# Patient Record
Sex: Female | Born: 1935 | Race: White | Hispanic: No | Marital: Married | Smoking: Never smoker
Health system: Southern US, Community
[De-identification: ages and names within clinical notes are randomized; demographics above are authoritative.]

## PROBLEM LIST (undated history)

## (undated) DIAGNOSIS — I340 Nonrheumatic mitral (valve) insufficiency: Secondary | ICD-10-CM

## (undated) DIAGNOSIS — I482 Chronic atrial fibrillation, unspecified: Secondary | ICD-10-CM

## (undated) DIAGNOSIS — I10 Essential (primary) hypertension: Secondary | ICD-10-CM

## (undated) DIAGNOSIS — I2510 Atherosclerotic heart disease of native coronary artery without angina pectoris: Secondary | ICD-10-CM

## (undated) DIAGNOSIS — I272 Pulmonary hypertension, unspecified: Secondary | ICD-10-CM

## (undated) DIAGNOSIS — M66329 Spontaneous rupture of flexor tendons, unspecified upper arm: Secondary | ICD-10-CM

## (undated) DIAGNOSIS — F339 Major depressive disorder, recurrent, unspecified: Secondary | ICD-10-CM

## (undated) DIAGNOSIS — S43016A Anterior dislocation of unspecified humerus, initial encounter: Secondary | ICD-10-CM

## (undated) DIAGNOSIS — M75100 Unspecified rotator cuff tear or rupture of unspecified shoulder, not specified as traumatic: Secondary | ICD-10-CM

## (undated) DIAGNOSIS — E785 Hyperlipidemia, unspecified: Secondary | ICD-10-CM

## (undated) DIAGNOSIS — C50919 Malignant neoplasm of unspecified site of unspecified female breast: Secondary | ICD-10-CM

## (undated) DIAGNOSIS — I5032 Chronic diastolic (congestive) heart failure: Secondary | ICD-10-CM

## (undated) DIAGNOSIS — R5381 Other malaise: Secondary | ICD-10-CM

## (undated) DIAGNOSIS — IMO0002 Reserved for concepts with insufficient information to code with codable children: Secondary | ICD-10-CM

## (undated) DIAGNOSIS — E669 Obesity, unspecified: Secondary | ICD-10-CM

## (undated) DIAGNOSIS — I350 Nonrheumatic aortic (valve) stenosis: Secondary | ICD-10-CM

## (undated) DIAGNOSIS — D649 Anemia, unspecified: Secondary | ICD-10-CM

## (undated) DIAGNOSIS — M1990 Unspecified osteoarthritis, unspecified site: Secondary | ICD-10-CM

## (undated) DIAGNOSIS — L719 Rosacea, unspecified: Secondary | ICD-10-CM

## (undated) DIAGNOSIS — E039 Hypothyroidism, unspecified: Secondary | ICD-10-CM

## (undated) DIAGNOSIS — R5383 Other fatigue: Secondary | ICD-10-CM

## (undated) HISTORY — DX: Chronic atrial fibrillation, unspecified: I48.20

## (undated) HISTORY — DX: Anterior dislocation of unspecified humerus, initial encounter: S43.016A

## (undated) HISTORY — DX: Spontaneous rupture of flexor tendons, unspecified upper arm: M66.329

## (undated) HISTORY — PX: ABDOMINAL HYSTERECTOMY: SHX81

## (undated) HISTORY — DX: Reserved for concepts with insufficient information to code with codable children: IMO0002

## (undated) HISTORY — DX: Anemia, unspecified: D64.9

## (undated) HISTORY — DX: Unspecified rotator cuff tear or rupture of unspecified shoulder, not specified as traumatic: M75.100

## (undated) HISTORY — DX: Unspecified osteoarthritis, unspecified site: M19.90

## (undated) HISTORY — DX: Major depressive disorder, recurrent, unspecified: F33.9

## (undated) HISTORY — DX: Rosacea, unspecified: L71.9

## (undated) HISTORY — DX: Nonrheumatic aortic (valve) stenosis: I35.0

## (undated) HISTORY — DX: Malignant neoplasm of unspecified site of unspecified female breast: C50.919

## (undated) HISTORY — DX: Other fatigue: R53.83

## (undated) HISTORY — PX: TONSILLECTOMY: SUR1361

## (undated) HISTORY — DX: Chronic diastolic (congestive) heart failure: I50.32

## (undated) HISTORY — DX: Other malaise: R53.81

## (undated) HISTORY — PX: MASTECTOMY: SHX3

---

## 1998-03-28 ENCOUNTER — Ambulatory Visit: Admission: RE | Admit: 1998-03-28 | Discharge: 1998-03-28 | Payer: Self-pay | Admitting: Obstetrics and Gynecology

## 1998-03-28 ENCOUNTER — Encounter: Payer: Self-pay | Admitting: Obstetrics and Gynecology

## 1998-07-26 ENCOUNTER — Inpatient Hospital Stay (HOSPITAL_COMMUNITY): Admission: EM | Admit: 1998-07-26 | Discharge: 1998-07-27 | Payer: Self-pay | Admitting: Cardiology

## 1998-07-31 ENCOUNTER — Encounter: Admission: RE | Admit: 1998-07-31 | Discharge: 1998-07-31 | Payer: Self-pay | Admitting: Sports Medicine

## 1998-08-01 ENCOUNTER — Encounter: Admission: RE | Admit: 1998-08-01 | Discharge: 1998-08-01 | Payer: Self-pay | Admitting: Family Medicine

## 1998-08-02 ENCOUNTER — Encounter: Admission: RE | Admit: 1998-08-02 | Discharge: 1998-08-02 | Payer: Self-pay | Admitting: Family Medicine

## 1998-08-07 ENCOUNTER — Encounter: Admission: RE | Admit: 1998-08-07 | Discharge: 1998-08-07 | Payer: Self-pay | Admitting: Family Medicine

## 1998-08-10 ENCOUNTER — Encounter: Admission: RE | Admit: 1998-08-10 | Discharge: 1998-08-10 | Payer: Self-pay | Admitting: Family Medicine

## 1998-08-17 ENCOUNTER — Encounter: Admission: RE | Admit: 1998-08-17 | Discharge: 1998-08-17 | Payer: Self-pay | Admitting: Family Medicine

## 1998-08-24 ENCOUNTER — Encounter: Admission: RE | Admit: 1998-08-24 | Discharge: 1998-08-24 | Payer: Self-pay | Admitting: Sports Medicine

## 1998-09-04 ENCOUNTER — Encounter: Admission: RE | Admit: 1998-09-04 | Discharge: 1998-09-04 | Payer: Self-pay | Admitting: Family Medicine

## 1998-09-21 ENCOUNTER — Encounter: Admission: RE | Admit: 1998-09-21 | Discharge: 1998-09-21 | Payer: Self-pay | Admitting: Family Medicine

## 1998-09-28 ENCOUNTER — Encounter: Admission: RE | Admit: 1998-09-28 | Discharge: 1998-09-28 | Payer: Self-pay | Admitting: Family Medicine

## 1998-10-09 ENCOUNTER — Encounter: Admission: RE | Admit: 1998-10-09 | Discharge: 1998-10-09 | Payer: Self-pay | Admitting: Sports Medicine

## 1998-10-24 ENCOUNTER — Encounter: Admission: RE | Admit: 1998-10-24 | Discharge: 1998-10-24 | Payer: Self-pay | Admitting: Family Medicine

## 1998-10-30 ENCOUNTER — Encounter: Admission: RE | Admit: 1998-10-30 | Discharge: 1998-10-30 | Payer: Self-pay | Admitting: Sports Medicine

## 1998-11-22 ENCOUNTER — Encounter: Admission: RE | Admit: 1998-11-22 | Discharge: 1998-11-22 | Payer: Self-pay | Admitting: Family Medicine

## 1998-11-29 ENCOUNTER — Encounter: Admission: RE | Admit: 1998-11-29 | Discharge: 1998-11-29 | Payer: Self-pay | Admitting: Family Medicine

## 1998-12-06 ENCOUNTER — Encounter: Admission: RE | Admit: 1998-12-06 | Discharge: 1998-12-06 | Payer: Self-pay | Admitting: Family Medicine

## 1998-12-13 ENCOUNTER — Encounter: Admission: RE | Admit: 1998-12-13 | Discharge: 1998-12-13 | Payer: Self-pay | Admitting: Family Medicine

## 1998-12-27 ENCOUNTER — Encounter: Admission: RE | Admit: 1998-12-27 | Discharge: 1998-12-27 | Payer: Self-pay | Admitting: Family Medicine

## 1999-01-10 ENCOUNTER — Encounter: Admission: RE | Admit: 1999-01-10 | Discharge: 1999-01-10 | Payer: Self-pay | Admitting: Family Medicine

## 1999-01-23 ENCOUNTER — Encounter: Admission: RE | Admit: 1999-01-23 | Discharge: 1999-01-23 | Payer: Self-pay | Admitting: Sports Medicine

## 1999-03-13 ENCOUNTER — Encounter: Admission: RE | Admit: 1999-03-13 | Discharge: 1999-03-13 | Payer: Self-pay | Admitting: Sports Medicine

## 1999-04-23 ENCOUNTER — Ambulatory Visit (HOSPITAL_COMMUNITY): Admission: RE | Admit: 1999-04-23 | Discharge: 1999-04-23 | Payer: Self-pay | Admitting: Obstetrics and Gynecology

## 1999-04-23 ENCOUNTER — Encounter: Payer: Self-pay | Admitting: Obstetrics and Gynecology

## 1999-06-07 ENCOUNTER — Encounter: Admission: RE | Admit: 1999-06-07 | Discharge: 1999-06-07 | Payer: Self-pay | Admitting: Sports Medicine

## 1999-07-30 ENCOUNTER — Other Ambulatory Visit: Admission: RE | Admit: 1999-07-30 | Discharge: 1999-07-30 | Payer: Self-pay | Admitting: Obstetrics and Gynecology

## 1999-08-23 ENCOUNTER — Encounter: Admission: RE | Admit: 1999-08-23 | Discharge: 1999-08-23 | Payer: Self-pay | Admitting: Sports Medicine

## 1999-12-17 ENCOUNTER — Encounter: Admission: RE | Admit: 1999-12-17 | Discharge: 1999-12-17 | Payer: Self-pay | Admitting: Family Medicine

## 1999-12-20 ENCOUNTER — Encounter: Admission: RE | Admit: 1999-12-20 | Discharge: 1999-12-20 | Payer: Self-pay | Admitting: Family Medicine

## 2000-04-21 ENCOUNTER — Encounter: Admission: RE | Admit: 2000-04-21 | Discharge: 2000-04-21 | Payer: Self-pay | Admitting: Family Medicine

## 2000-04-24 ENCOUNTER — Encounter: Admission: RE | Admit: 2000-04-24 | Discharge: 2000-04-24 | Payer: Self-pay | Admitting: Sports Medicine

## 2000-09-03 ENCOUNTER — Other Ambulatory Visit: Admission: RE | Admit: 2000-09-03 | Discharge: 2000-09-03 | Payer: Self-pay | Admitting: Obstetrics and Gynecology

## 2000-10-17 ENCOUNTER — Encounter: Admission: RE | Admit: 2000-10-17 | Discharge: 2000-10-17 | Payer: Self-pay | Admitting: Sports Medicine

## 2000-10-17 ENCOUNTER — Encounter: Payer: Self-pay | Admitting: Sports Medicine

## 2000-12-02 ENCOUNTER — Encounter: Admission: RE | Admit: 2000-12-02 | Discharge: 2000-12-02 | Payer: Self-pay | Admitting: Family Medicine

## 2000-12-11 ENCOUNTER — Encounter: Admission: RE | Admit: 2000-12-11 | Discharge: 2000-12-11 | Payer: Self-pay | Admitting: Sports Medicine

## 2001-05-14 ENCOUNTER — Encounter: Admission: RE | Admit: 2001-05-14 | Discharge: 2001-05-14 | Payer: Self-pay | Admitting: Sports Medicine

## 2001-07-16 ENCOUNTER — Ambulatory Visit (HOSPITAL_COMMUNITY): Admission: RE | Admit: 2001-07-16 | Discharge: 2001-07-16 | Payer: Self-pay | Admitting: Family Medicine

## 2001-11-26 ENCOUNTER — Emergency Department (HOSPITAL_COMMUNITY): Admission: EM | Admit: 2001-11-26 | Discharge: 2001-11-26 | Payer: Self-pay | Admitting: Emergency Medicine

## 2002-04-08 ENCOUNTER — Encounter: Admission: RE | Admit: 2002-04-08 | Discharge: 2002-04-08 | Payer: Self-pay | Admitting: Family Medicine

## 2002-07-06 ENCOUNTER — Encounter: Admission: RE | Admit: 2002-07-06 | Discharge: 2002-07-06 | Payer: Self-pay | Admitting: Family Medicine

## 2002-07-08 ENCOUNTER — Encounter: Admission: RE | Admit: 2002-07-08 | Discharge: 2002-07-08 | Payer: Self-pay | Admitting: Sports Medicine

## 2003-03-25 ENCOUNTER — Encounter: Admission: RE | Admit: 2003-03-25 | Discharge: 2003-03-25 | Payer: Self-pay | Admitting: Sports Medicine

## 2003-04-14 ENCOUNTER — Encounter: Admission: RE | Admit: 2003-04-14 | Discharge: 2003-04-14 | Payer: Self-pay | Admitting: Sports Medicine

## 2003-08-04 ENCOUNTER — Encounter: Admission: RE | Admit: 2003-08-04 | Discharge: 2003-08-04 | Payer: Self-pay | Admitting: Sports Medicine

## 2003-08-24 ENCOUNTER — Encounter: Admission: RE | Admit: 2003-08-24 | Discharge: 2003-08-24 | Payer: Self-pay | Admitting: Family Medicine

## 2003-12-08 ENCOUNTER — Encounter: Admission: RE | Admit: 2003-12-08 | Discharge: 2003-12-08 | Payer: Self-pay | Admitting: Family Medicine

## 2004-04-27 ENCOUNTER — Encounter: Admission: RE | Admit: 2004-04-27 | Discharge: 2004-04-27 | Payer: Self-pay | Admitting: Sports Medicine

## 2004-07-26 ENCOUNTER — Ambulatory Visit: Payer: Self-pay | Admitting: Sports Medicine

## 2004-08-10 ENCOUNTER — Ambulatory Visit: Payer: Self-pay | Admitting: Sports Medicine

## 2004-08-30 ENCOUNTER — Ambulatory Visit: Payer: Self-pay | Admitting: Sports Medicine

## 2004-10-05 ENCOUNTER — Encounter: Admission: RE | Admit: 2004-10-05 | Discharge: 2004-10-05 | Payer: Self-pay | Admitting: Sports Medicine

## 2004-10-05 ENCOUNTER — Ambulatory Visit: Payer: Self-pay | Admitting: Sports Medicine

## 2004-11-22 ENCOUNTER — Ambulatory Visit: Payer: Self-pay | Admitting: Sports Medicine

## 2005-03-27 ENCOUNTER — Ambulatory Visit: Payer: Self-pay | Admitting: Family Medicine

## 2005-06-14 ENCOUNTER — Encounter: Admission: RE | Admit: 2005-06-14 | Discharge: 2005-06-14 | Payer: Self-pay | Admitting: Sports Medicine

## 2006-07-02 ENCOUNTER — Encounter: Admission: RE | Admit: 2006-07-02 | Discharge: 2006-07-02 | Payer: Self-pay | Admitting: Sports Medicine

## 2006-07-03 DIAGNOSIS — M1990 Unspecified osteoarthritis, unspecified site: Secondary | ICD-10-CM

## 2006-07-03 DIAGNOSIS — F339 Major depressive disorder, recurrent, unspecified: Secondary | ICD-10-CM | POA: Insufficient documentation

## 2006-07-03 DIAGNOSIS — L719 Rosacea, unspecified: Secondary | ICD-10-CM

## 2006-07-03 DIAGNOSIS — I10 Essential (primary) hypertension: Secondary | ICD-10-CM | POA: Insufficient documentation

## 2006-07-17 ENCOUNTER — Ambulatory Visit (HOSPITAL_COMMUNITY): Admission: RE | Admit: 2006-07-17 | Discharge: 2006-07-17 | Payer: Self-pay | Admitting: Sports Medicine

## 2006-07-17 ENCOUNTER — Ambulatory Visit: Payer: Self-pay | Admitting: Sports Medicine

## 2006-07-17 DIAGNOSIS — I482 Chronic atrial fibrillation, unspecified: Secondary | ICD-10-CM

## 2006-07-17 DIAGNOSIS — E039 Hypothyroidism, unspecified: Secondary | ICD-10-CM | POA: Insufficient documentation

## 2006-07-17 DIAGNOSIS — E669 Obesity, unspecified: Secondary | ICD-10-CM

## 2006-07-17 HISTORY — DX: Chronic atrial fibrillation, unspecified: I48.20

## 2006-07-17 LAB — CONVERTED CEMR LAB
Cholesterol: 243 mg/dL — ABNORMAL HIGH (ref 0–200)
HCT: 41.5 %
Hemoglobin: 13.9 g/dL
TSH: 2.733 microintl units/mL (ref 0.350–5.50)

## 2006-07-30 ENCOUNTER — Telehealth: Payer: Self-pay | Admitting: Sports Medicine

## 2006-08-14 ENCOUNTER — Encounter: Payer: Self-pay | Admitting: Sports Medicine

## 2006-08-28 ENCOUNTER — Ambulatory Visit: Payer: Self-pay | Admitting: Sports Medicine

## 2006-09-23 ENCOUNTER — Telehealth: Payer: Self-pay | Admitting: Sports Medicine

## 2006-11-17 ENCOUNTER — Encounter: Payer: Self-pay | Admitting: *Deleted

## 2007-02-19 ENCOUNTER — Ambulatory Visit: Payer: Self-pay | Admitting: Sports Medicine

## 2007-02-19 DIAGNOSIS — IMO0001 Reserved for inherently not codable concepts without codable children: Secondary | ICD-10-CM

## 2007-02-24 LAB — CONVERTED CEMR LAB
Anti Nuclear Antibody(ANA): NEGATIVE
HCT: 34.7 % — ABNORMAL LOW (ref 36.0–46.0)
Hemoglobin: 10.4 g/dL — ABNORMAL LOW (ref 12.0–15.0)
MCHC: 30 g/dL (ref 30.0–36.0)
MCV: 83.2 fL (ref 78.0–100.0)
Platelets: 318 10*3/uL (ref 150–400)
RBC: 4.17 M/uL (ref 3.87–5.11)
RDW: 14.5 % — ABNORMAL HIGH (ref 11.5–14.0)
Rheumatoid fact SerPl-aCnc: <20 intl units/mL (ref 0–20)
Sed Rate: 14 mm/hr (ref 0–22)
Total CK: 75 units/L (ref 7–177)
WBC: 7.7 10*3/uL (ref 4.0–10.5)

## 2007-02-26 ENCOUNTER — Ambulatory Visit: Payer: Self-pay | Admitting: Family Medicine

## 2007-02-26 ENCOUNTER — Encounter: Payer: Self-pay | Admitting: Sports Medicine

## 2007-02-26 DIAGNOSIS — D649 Anemia, unspecified: Secondary | ICD-10-CM | POA: Insufficient documentation

## 2007-02-26 LAB — CONVERTED CEMR LAB: Ferritin: 5 ng/mL — ABNORMAL LOW (ref 10–291)

## 2007-03-03 ENCOUNTER — Encounter (INDEPENDENT_AMBULATORY_CARE_PROVIDER_SITE_OTHER): Payer: Self-pay | Admitting: *Deleted

## 2007-03-06 ENCOUNTER — Encounter: Payer: Self-pay | Admitting: Sports Medicine

## 2007-03-17 ENCOUNTER — Encounter: Payer: Self-pay | Admitting: Sports Medicine

## 2007-03-31 ENCOUNTER — Encounter: Payer: Self-pay | Admitting: Sports Medicine

## 2007-04-09 ENCOUNTER — Ambulatory Visit: Payer: Self-pay | Admitting: Sports Medicine

## 2007-04-20 ENCOUNTER — Ambulatory Visit (HOSPITAL_COMMUNITY): Admission: RE | Admit: 2007-04-20 | Discharge: 2007-04-20 | Payer: Self-pay | Admitting: Gastroenterology

## 2007-04-21 ENCOUNTER — Encounter: Payer: Self-pay | Admitting: Sports Medicine

## 2007-04-22 ENCOUNTER — Encounter: Payer: Self-pay | Admitting: Sports Medicine

## 2007-06-01 ENCOUNTER — Encounter: Payer: Self-pay | Admitting: *Deleted

## 2007-06-10 ENCOUNTER — Telehealth: Payer: Self-pay | Admitting: *Deleted

## 2007-06-15 ENCOUNTER — Encounter: Payer: Self-pay | Admitting: *Deleted

## 2007-06-17 ENCOUNTER — Ambulatory Visit: Payer: Self-pay | Admitting: Sports Medicine

## 2007-06-18 LAB — CONVERTED CEMR LAB
HCT: 33.6 % — ABNORMAL LOW (ref 36.0–46.0)
Hemoglobin: 10.6 g/dL — ABNORMAL LOW (ref 12.0–15.0)
MCHC: 31.5 g/dL (ref 30.0–36.0)
MCV: 87.5 fL (ref 78.0–100.0)
Platelets: 290 10*3/uL (ref 150–400)
RBC: 3.84 M/uL — ABNORMAL LOW (ref 3.87–5.11)
RDW: 17.7 % — ABNORMAL HIGH (ref 11.5–15.5)
WBC: 8.4 10*3/uL (ref 4.0–10.5)

## 2007-07-09 ENCOUNTER — Ambulatory Visit: Payer: Self-pay | Admitting: Sports Medicine

## 2007-09-23 ENCOUNTER — Ambulatory Visit: Payer: Self-pay | Admitting: Family Medicine

## 2007-09-23 ENCOUNTER — Encounter: Payer: Self-pay | Admitting: Sports Medicine

## 2007-09-23 LAB — CONVERTED CEMR LAB
HCT: 32.5 % — ABNORMAL LOW (ref 36.0–46.0)
Hemoglobin: 9.9 g/dL — ABNORMAL LOW (ref 12.0–15.0)
MCHC: 30.5 g/dL (ref 30.0–36.0)
MCV: 87.4 fL (ref 78.0–100.0)
Platelets: 312 10*3/uL (ref 150–400)
RBC: 3.72 M/uL — ABNORMAL LOW (ref 3.87–5.11)
RDW: 15.1 % (ref 11.5–15.5)
WBC: 8.3 10*3/uL (ref 4.0–10.5)

## 2007-09-24 ENCOUNTER — Telehealth: Payer: Self-pay | Admitting: *Deleted

## 2007-09-25 ENCOUNTER — Encounter: Payer: Self-pay | Admitting: *Deleted

## 2007-09-25 ENCOUNTER — Telehealth: Payer: Self-pay | Admitting: *Deleted

## 2007-09-29 ENCOUNTER — Encounter: Payer: Self-pay | Admitting: Sports Medicine

## 2007-09-29 ENCOUNTER — Ambulatory Visit: Payer: Self-pay | Admitting: Family Medicine

## 2007-09-29 LAB — CONVERTED CEMR LAB
Basophils Absolute: 0 10*3/uL (ref 0.0–0.1)
Basophils Relative: 0 % (ref 0–1)
Eosinophils Absolute: 1 10*3/uL — ABNORMAL HIGH (ref 0.0–0.7)
Eosinophils Relative: 13 % — ABNORMAL HIGH (ref 0–5)
Ferritin: 5 ng/mL — ABNORMAL LOW (ref 10–291)
HCT: 30.4 % — ABNORMAL LOW (ref 36.0–46.0)
Hemoglobin: 9.1 g/dL — ABNORMAL LOW (ref 12.0–15.0)
Lymphocytes Relative: 16 % (ref 12–46)
Lymphs Abs: 1.2 10*3/uL (ref 0.7–4.0)
MCHC: 29.9 g/dL — ABNORMAL LOW (ref 30.0–36.0)
MCV: 86.4 fL (ref 78.0–100.0)
Monocytes Absolute: 0.7 10*3/uL (ref 0.1–1.0)
Monocytes Relative: 9 % (ref 3–12)
Neutro Abs: 4.7 10*3/uL (ref 1.7–7.7)
Neutrophils Relative %: 61 % (ref 43–77)
Platelets: 282 10*3/uL (ref 150–400)
RBC: 3.52 M/uL — ABNORMAL LOW (ref 3.87–5.11)
RDW: 15 % (ref 11.5–15.5)
WBC: 7.6 10*3/uL (ref 4.0–10.5)

## 2007-10-02 ENCOUNTER — Encounter: Payer: Self-pay | Admitting: Sports Medicine

## 2007-10-07 ENCOUNTER — Encounter: Payer: Self-pay | Admitting: Sports Medicine

## 2007-10-07 LAB — CONVERTED CEMR LAB
OCCULT 1: POSITIVE
OCCULT 2: POSITIVE
OCCULT 3: POSITIVE

## 2007-10-08 ENCOUNTER — Encounter (HOSPITAL_COMMUNITY): Admission: RE | Admit: 2007-10-08 | Discharge: 2008-01-06 | Payer: Self-pay | Admitting: Sports Medicine

## 2007-10-08 ENCOUNTER — Encounter: Payer: Self-pay | Admitting: Sports Medicine

## 2007-10-21 ENCOUNTER — Encounter: Admission: RE | Admit: 2007-10-21 | Discharge: 2007-10-21 | Payer: Self-pay | Admitting: Sports Medicine

## 2007-10-22 ENCOUNTER — Encounter: Payer: Self-pay | Admitting: Sports Medicine

## 2007-11-23 ENCOUNTER — Encounter: Payer: Self-pay | Admitting: Sports Medicine

## 2007-11-25 ENCOUNTER — Encounter: Payer: Self-pay | Admitting: Sports Medicine

## 2008-03-02 ENCOUNTER — Ambulatory Visit (HOSPITAL_COMMUNITY): Admission: RE | Admit: 2008-03-02 | Discharge: 2008-03-02 | Payer: Self-pay | Admitting: Geriatric Medicine

## 2008-09-16 ENCOUNTER — Ambulatory Visit: Payer: Self-pay | Admitting: Sports Medicine

## 2008-09-16 DIAGNOSIS — M25519 Pain in unspecified shoulder: Secondary | ICD-10-CM

## 2008-10-07 ENCOUNTER — Ambulatory Visit: Payer: Self-pay | Admitting: Sports Medicine

## 2008-11-09 ENCOUNTER — Ambulatory Visit: Payer: Self-pay | Admitting: Sports Medicine

## 2009-01-11 ENCOUNTER — Encounter: Admission: RE | Admit: 2009-01-11 | Discharge: 2009-01-11 | Payer: Self-pay | Admitting: Geriatric Medicine

## 2009-03-02 ENCOUNTER — Ambulatory Visit: Payer: Self-pay | Admitting: Sports Medicine

## 2009-03-02 DIAGNOSIS — M79609 Pain in unspecified limb: Secondary | ICD-10-CM | POA: Insufficient documentation

## 2009-03-02 DIAGNOSIS — M66329 Spontaneous rupture of flexor tendons, unspecified upper arm: Secondary | ICD-10-CM

## 2009-06-05 ENCOUNTER — Ambulatory Visit: Payer: Self-pay | Admitting: Sports Medicine

## 2009-06-05 DIAGNOSIS — M25569 Pain in unspecified knee: Secondary | ICD-10-CM | POA: Insufficient documentation

## 2009-07-03 ENCOUNTER — Ambulatory Visit: Payer: Self-pay | Admitting: Sports Medicine

## 2009-08-14 ENCOUNTER — Ambulatory Visit: Payer: Self-pay | Admitting: Sports Medicine

## 2009-08-14 DIAGNOSIS — M75100 Unspecified rotator cuff tear or rupture of unspecified shoulder, not specified as traumatic: Secondary | ICD-10-CM

## 2009-08-14 DIAGNOSIS — IMO0002 Reserved for concepts with insufficient information to code with codable children: Secondary | ICD-10-CM | POA: Insufficient documentation

## 2009-09-11 ENCOUNTER — Encounter: Payer: Self-pay | Admitting: Family Medicine

## 2009-12-14 ENCOUNTER — Ambulatory Visit: Payer: Self-pay | Admitting: Sports Medicine

## 2010-02-27 ENCOUNTER — Encounter: Admission: RE | Admit: 2010-02-27 | Discharge: 2010-02-27 | Payer: Self-pay | Admitting: Geriatric Medicine

## 2010-03-26 ENCOUNTER — Ambulatory Visit: Payer: Self-pay | Admitting: Sports Medicine

## 2010-03-26 DIAGNOSIS — S43016A Anterior dislocation of unspecified humerus, initial encounter: Secondary | ICD-10-CM | POA: Insufficient documentation

## 2010-04-03 ENCOUNTER — Ambulatory Visit: Payer: Self-pay | Admitting: Sports Medicine

## 2010-04-24 ENCOUNTER — Ambulatory Visit: Payer: Self-pay | Admitting: Sports Medicine

## 2010-05-15 ENCOUNTER — Ambulatory Visit
Admission: RE | Admit: 2010-05-15 | Discharge: 2010-05-15 | Payer: Self-pay | Source: Home / Self Care | Attending: Sports Medicine | Admitting: Sports Medicine

## 2010-06-05 ENCOUNTER — Ambulatory Visit
Admission: RE | Admit: 2010-06-05 | Discharge: 2010-06-05 | Payer: Self-pay | Source: Home / Self Care | Attending: Sports Medicine | Admitting: Sports Medicine

## 2010-06-05 NOTE — Letter (Signed)
Summary: Wellness Visit Letter  [facility]  [address]   [place], [place] [address]   Phone: [phone]  Fax: [phone]    09/11/2009  Tara Mack [address] [place], [place]  [address]  Dear Ms. Bierlein,  We are happy to let you know that since you are covered under Medicare you are able to have a FREE visit at the [facility] to discuss your HEALTH. This is a new benefit for Medicare.  There will be no co-payment.  At this visit you will meet with Luretha Murphy an expert in wellness and the nurse practitioner at our clinic.  At this visit we will discuss ways to keep you healthy and feeling well.  This visit will not replace your regular doctor visit and we cannot refill medications.  We may schedule future blood work, give shots if needed, or schedule tests to look for hidden problems.   You will need to plan to be here at least one hour to talk about your medical history, your current status, review all of your medications, and discuss your future plans for your health.  This information will be entered into your record for your doctor to have and review.  If you are interested in staying healthy, this type of visit can help.  Please call the office at: [phone], to schedule a "Medicare Wellness Visit".  The day of the visit you should bring in all of your medications, including any vitamins, herbs, over the counter products you take.  Make a list of all the other doctors that you see, so we know who they are. If you have any other health documents please bring them.  We look forward to helping you stay healthy.  Sincerely,    Luretha Murphy NP  Appended Document: Wellness Visit Letter mailed.

## 2010-06-05 NOTE — Assessment & Plan Note (Signed)
Summary: L ARM/ R KNEE PAIN,MC   Vital Signs:  Patient profile:   77 year old female BP sitting:   187 / 85  Vitals Entered By: Lillia Pauls CMA (June 05, 2009 11:03 AM)  Serial Vital Signs/Assessments:  Time      Position  BP       Pulse  Resp  Temp     By 11:49 AM            150/78                         Delbert Harness MD   History of Present Illness: 77 yo here today for re-eval of left biceps tendon rupture and for new right knee pain.  1.) Patient with rupture of left biceps tendon October 2010.  Since that time has chosen conservative treatment.  Reports minimal pain with good range of motion and stregth.  Would like to return to light weightlifting.  2.) Two-week history of right knee pain with onset at time was walking down stairs and knee "gave way."  Was able to immediately continue walking.  Became swollen the next day.  Since that time pain has improved, but continues to have some swelling.  No popping, locking, or clicking, giving way since then.  has been takign voltaren as she had been for other MSK pain.  Allergies: No Known Drug Allergies  Review of Systems      See HPI MS:  Complains of joint pain and joint swelling; denies joint redness, loss of strength, muscle aches, and cramps.  Physical Exam  General:  obese, in no acute distress; alert,appropriate and cooperative throughout examination.  appears younger than age. Msk:  L arm: Popeye deformity, no TTP FROM shoulder and elbow, good stregth bilaterally strength on left is now moderate  R knee: suprapatellar effusion.  + mcmurrays with Crepitus.  Mild tenderness to palpation over superiolateral effusion and anteromedially.   No tenderness over joint line or patella.  Full extension of knee with no pain on valgus or varus stress.  No joint laxity.  US shows significant effusion with vastus medialis tear and tear in medial meniscus. swelling over meniscal area There is a large suprapatellar pouch  effusion the vast lateralis tendon is retracted and there is a large superficial pouch that is filled with fluid   Impression & Recommendations:  Problem # 1:  BICEPS TENDON RUPTURE, LEFT (ICD-727.62) Healing well.  Patient may restart light weights as tolerated.  Problem # 2:  KNEE PAIN, RIGHT (ICD-719.46)  s/p injury 2 weeks ago likely resulting in vastus medialis/medial meniscus tear with resulting effusion.  Improving clinically.  Will fit for knee brace today and instructed on non-weight bearing exercises such as cycling over walking and given straight leg raises to do from home.     Her updated medication list for this problem includes:    Diclofenac Sodium 50 Mg Tbec (Diclofenac sodium) .Marland Kitchen... Tablet by mouth twice a day    Vicodin 5-500 Mg Tabs (Hydrocodone-acetaminophen) .Marland Kitchen... Take one every 6 hours as needed for  pain  Orders: Knee Support Pat cutout [phone])  Complete Medication List: 1)  Diclofenac Sodium 50 Mg Tbec (Diclofenac sodium) .... Tablet by mouth twice a day 2)  Ritalin 10 Mg Tabs (Methylphenidate hcl) .... Take one tablet twice a day as directed 3)  Hyzaar 100-25 Mg Tabs (Losartan potassium-hctz) .... Take 1 tablet by mouth once a day 4)  Retin-a 0.05 %  Crea (Tretinoin) .... Apply 1 a small amount to skin once a day 5)  Rhinocort Aqua 32 Mcg/act Susp (Budesonide (nasal)) .... Inhale 1 spray as directed twice a day 6)  Synthroid 50 Mcg Tabs (Levothyroxine sodium) .... Take 1 tablet by mouth once a day 7)  Vivelle-dot 0.1 Mg/24hr Pttw (Estradiol) .Marland Kitchen.. 1 patch to skin twice a week 8)  Effexor Xr 75 Mg Cp24 (Venlafaxine hcl) .... Take one at bedtime 9)  Vicodin 5-500 Mg Tabs (Hydrocodone-acetaminophen) .... Take one every 6 hours as needed for  pain 10)  Ferrous Sulfate 220 (44 Fe) Mg/57ml Elix (Ferrous sulfate) .Marland Kitchen.. 1 tsp tid 11)  Lidoderm 5 % Ptch (Lidocaine) .... Use as directed for 12 hours daily 12)  Voltaren 1 % Gel (Diclofenac sodium) .Marland Kitchen.. 1 to 2 grams qid as  directed  Patient Instructions: 1)  Do straight leg raises as instructed. 2)  Wear knee brace for stability. 3)  May resume light arm weight lifting. 4)  Try cycling instead of treadmill while your knee is healing. 5)  Follow-up with your PCP for your elevated blood pressure. 6)  Follow-up in 1 month

## 2010-06-05 NOTE — Assessment & Plan Note (Signed)
Summary: F/U [facility]   Vital Signs:  Patient profile:   77 year old female BP sitting:   160 / 90  Vitals Entered By: Lillia Pauls CMA (July 03, 2009 11:50 AM)  History of Present Illness: Kriston returns for f.u RT knee pain feels swelling is less VMO tear noted last visit knee - don joy support makes her more stable this week tried some treadmill and bike and was able to do without much pain  left biceps tendon rupture tried using light weights again no swellikng afterwards less aching on most days now  Allergies: No Known Drug Allergies  Physical Exam  General:  Well-developed,well-nourished,in no acute distress; alert,appropriate and cooperative throughout examination Msk:  localized swelling over distal VMO on RT knee joint exam shows no effusion; stable ligaments; + clicking on Mcmurray's and provocative meniscal tests;these are slightly painful  non painful patellar compression; patellar and quadriceps tendons unremarkable.  left biceps shows popeye deformity now non tender no swelling or bruising  Additional Exam:  MSK [place] RT knee shows a collection of fluid at distal VMO This is significant but less than last visit suprapatellar pouch shows less swelling but slight increase remainder of joint without sings of effusion  Left biceps shows defined defect no real swelling noted or change with doppler  images saved   Impression & Recommendations:  Problem # 1:  KNEE PAIN, RIGHT (ICD-719.46)  Her updated medication list for this problem includes:    Diclofenac Sodium 50 Mg Tbec (Diclofenac sodium) .Marland Kitchen... Tablet by mouth twice a day    Vicodin 5-500 Mg Tabs (Hydrocodone-acetaminophen) .Marland Kitchen... Take one every 6 hours as needed for  pain  partial tear of VMO tendon is still present less swelling noted less pain  begin more quad exercises and some easy cycling  reck 1 month  Orders: [place] LIMITED ([id number])  Problem # 2:  BICEPS TENDON RUPTURE, LEFT  (ICD-727.62)  This looks more stable now on scan will let her start with light weights  Orders: [place] LIMITED ([id number])  Complete Medication List: 1)  Diclofenac Sodium 50 Mg Tbec (Diclofenac sodium) .... Tablet by mouth twice a day 2)  Ritalin 10 Mg Tabs (Methylphenidate hcl) .... Take one tablet twice a day as directed 3)  Hyzaar 100-25 Mg Tabs (Losartan potassium-hctz) .... Take 1 tablet by mouth once a day 4)  Retin-a 0.05 % Crea (Tretinoin) .... Apply 1 a small amount to skin once a day 5)  Rhinocort Aqua 32 Mcg/act Susp (Budesonide (nasal)) .... Inhale 1 spray as directed twice a day 6)  Synthroid 50 Mcg Tabs (Levothyroxine sodium) .... Take 1 tablet by mouth once a day 7)  Vivelle-dot 0.1 Mg/24hr Pttw (Estradiol) .Marland Kitchen.. 1 patch to skin twice a week 8)  Effexor Xr 75 Mg Cp24 (Venlafaxine hcl) .... Take one at bedtime 9)  Vicodin 5-500 Mg Tabs (Hydrocodone-acetaminophen) .... Take one every 6 hours as needed for  pain 10)  Ferrous Sulfate 220 (44 Fe) Mg/58ml Elix (Ferrous sulfate) .Marland Kitchen.. 1 tsp tid 11)  Lidoderm 5 % Ptch (Lidocaine) .... Use as directed for 12 hours daily 12)  Voltaren 1 % Gel (Diclofenac sodium) .Marland Kitchen.. 1 to 2 grams qid as directed  Patient Instructions: 1)  arm 2)  easy curls - do sets of 6 to 8 reps 3)  easy forearm rolls 4)  triceps curls 5)  forward flexion - take with arm straight to 90 degs 6)  if not pain take up slightly higher 7)  leg  8)  continue straight leg raises 9)  do some side leg raises 10)  OK to use treadmill 11)  get some biking when you can

## 2010-06-05 NOTE — Assessment & Plan Note (Signed)
Summary: U/S DISLOCATED L SHOULDER PER FIELDS,MC   CC:  f/u L shoulder.  History of Present Illness: 77yo R-hand dominant female to office for f/u L shoulder. s/p L shoulder dislocation 2-weeks ago when fell in the bathroom in [place]. Still having some pain & stiffness.  Still with night-time pain.  Using percocet prn which seems to help.  Stopped using sling 1-week ago. Doing shoulder ROM exercises with shoulder circles & wall crawls.  Feels ROM is improving. Denies any numbness/tingling. Hx of Lt biceps long head rupture 02/2009.  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  TTP over bicipital groove, supraspinatus, & deltoid. Decreased ROM - flexion 130, abduction 130, IR L5, ER 30 - all with pain. Neg drop arm.  (+)empty can. +4/5 RTC strength with flexion, abduction, IR, ER. Some pain with bicep strength testing. - R shoulder with full ROM without pain, tenderness, weakness.  Neurovascularly intact distally Additional Exam:  MSK U/S: L shoulder - technically difficult study.  biceps tendon with large fluid collection in tendon with increased doppler flow - suspect acute on chronic biceps rupture.  Subscapularis without signs of acute tear.  Supraspinatus without signs of acute tear, large calcification noted near tendon  insertion.  Infraspinatus without signs of acute tear.  AC joint with moderate effusion & spurring.  Images saved.   Impression & Recommendations:  Problem # 1:  CLOSED ANTERIOR DISLOCATION OF HUMERUS (ICD-831.01) Assessment Improved - Improved ROM on exam today - MSK u/s revealed signs of acute on chronic biceps tendon tear, but no signs of full thickness RTC tear. - Should cont. with ROM exercises as shown last office visit.  Emphasized need to do exercises 2-3 times a day to prevent stiffness & frozen shoulder.  Plan to transition to strengthening as ROM & pain improve. - Cont. voltaren & percocet as needed - f/u 3-4 weeks for re-evaluation, encouraged to call with any questions/concerns.  Orders: [place] LIMITED ([id number])  Problem # 2:  BICEPS TENDON RUPTURE, LEFT (ICD-727.62) - Suspect acute on chronic L biceps tendon rupture  - MSK u/s revealed large fluid collection along Long Head of bicep with increased doppler flow. - Cont. ROM exercises for shoulder as stated above.  As pain in shoulder/arm improves plan to re-initiate bicep exercises. - f/u 3-4 weeks for re-evaluation, encouraged to call with questions/concerns.  Orders: [place] LIMITED ([id number])  Complete Medication List: 1)  Diclofenac Sodium 50 Mg Tbec (Diclofenac sodium) .... Tablet by mouth twice a day 2)  Ritalin 10 Mg Tabs (Methylphenidate hcl) .... Take one tablet twice a day as directed 3)  Hyzaar 100-25 Mg Tabs (Losartan potassium-hctz) .... Take 1 tablet by mouth once a day 4)  Retin-a 0.05 % Crea (Tretinoin) .... Apply 1 a small amount to skin once a day 5)  Rhinocort Aqua 32 Mcg/act Susp (Budesonide (nasal)) .Marland KitchenMarland KitchenMarland Kitchen  Inhale 1 spray as directed twice a day 6)  Synthroid 50 Mcg Tabs (Levothyroxine sodium) .... Take 1 tablet by mouth once a day 7)  Vivelle-dot 0.1 Mg/24hr Pttw (Estradiol) .Marland Kitchen.. 1 patch to skin twice a week 8)  Effexor Xr 75 Mg Cp24 (Venlafaxine hcl) .... Take one at bedtime 9)  Vicodin 5-500 Mg Tabs  (Hydrocodone-acetaminophen) .... Take one every 6 hours as needed for  pain 10)  Ferrous Sulfate 220 (44 Fe) Mg/39ml Elix (Ferrous sulfate) .Marland Kitchen.. 1 tsp tid 11)  Lidoderm 5 % Ptch (Lidocaine) .... Use as directed for 12 hours daily 12)  Voltaren 1 % Gel (Diclofenac sodium) .Marland Kitchen.. 1 to 2 grams qid as directed   Orders Added: 1)  Est. Patient Level III [[address]] 2)  [place] LIMITED [[id number]]

## 2010-06-05 NOTE — Assessment & Plan Note (Signed)
Summary: 12:00APPT,F/U SHOULDER PER FIELDS,MC   Vital Signs:  Patient profile:   77 year old female Pulse rate:   64 / minute BP sitting:   135 / 77  (right arm)  Vitals Entered By: Rochele Pages RN (March 26, 2010 11:58 AM) CC: f/u dislocated lt shoulder   CC:  f/u dislocated lt shoulder.  History of Present Illness: Pt presents to clinic for f/u of dislocated left shoulder sustained approximatly 1 week ago while on vacation in [place]. [place].  The shoulder was found to be dislocated per X-ray and was initially unsucessfuly reduced with a subsequent successful reduction.  No fracture was noted.   The patient has been immobilzing the shoulder with an arm sling but has been doing some very slight ROM while trying to avoid lifting, or any external rotaiton.  She does still require percocet on occasion and has had her sleep disrupted 2o/2 pain.    Allergies: No Known Drug Allergies  Physical Exam  General:  Well-developed,well-nourished, appropriate and cooperative throughout examination.  Mild physical distress with favoring her L arm and utilizing a sling for that arm.   Msk:  L arm:  TTP over L deltoid, supraspinatus & levator scapulae.  ROM tested to initiation of pain.    bruising upper biceeps OK with 30 deg flex. ext, adduction and abduction  old bicipital tendon rupture on Lt Skin:  Diffuse ecchymosis on L anterior/lateral aspect of arm.     Impression & Recommendations:  Problem # 1:  CLOSED ANTERIOR DISLOCATION OF HUMERUS (ICD-831.01) Pt to maintain ROM (limiting her range by pain) by focusing on Flexion and ABduction exercises.  She is to avoid external rotation, and extreme shoulder extension.  Wear the sling for comfort and continue pain medication as needed as provided from the ER.   Followup in 1 week to U/S and more thorough PE.  Plan to advance to strenthening exercises at that time  given Codman exercise sheet  Complete Medication List: 1)  Diclofenac Sodium 50 Mg  Tbec (Diclofenac sodium) .... Tablet by mouth twice a day 2)  Ritalin 10 Mg Tabs (Methylphenidate hcl) .... Take one tablet twice a day as directed 3)  Hyzaar 100-25 Mg Tabs (Losartan potassium-hctz) .... Take 1 tablet by mouth once a day 4)  Retin-a 0.05 % Crea (Tretinoin) .... Apply 1 a small amount to skin once a day 5)  Rhinocort Aqua 32 Mcg/act Susp (Budesonide (nasal)) .... Inhale 1 spray as directed twice a day 6)  Synthroid 50 Mcg Tabs (Levothyroxine sodium) .... Take 1 tablet by mouth once a day 7)  Vivelle-dot 0.1 Mg/24hr Pttw (Estradiol) .Marland Kitchen.. 1 patch to skin twice a week 8)  Effexor Xr 75 Mg Cp24 (Venlafaxine hcl) .... Take one at bedtime 9)  Vicodin 5-500 Mg Tabs (Hydrocodone-acetaminophen) .... Take one every 6 hours as needed for  pain 10)  Ferrous Sulfate 220 (44 Fe) Mg/63ml Elix (Ferrous sulfate) .Marland Kitchen.. 1 tsp tid 11)  Lidoderm 5 % Ptch (Lidocaine) .... Use as directed for 12 hours daily 12)  Voltaren 1 % Gel (Diclofenac sodium) .Marland Kitchen.. 1 to 2 grams qid as directed   Orders Added: 1)  Est. Patient Level III [[id number]]

## 2010-06-05 NOTE — Assessment & Plan Note (Signed)
Summary: F/U,MC   Vital Signs:  Patient profile:   77 year old female BP sitting:   172 / 82  Vitals Entered By: Lillia Pauls CMA (August 14, 2009 11:57 AM)  History of Present Illness: pt here today to f/u right knee pain and left bicep pain. Per pt, the R knee is feeling better, about 25% she states and the L bicep pain is significantly better at 65%. Now having some slight pain with the right arm that she attributes to arthritis. she does note that she has been using RT arm more since left injured  she feels like motin of left arm is almost normal now  RT knee still has swelling above knee but not worsening  Allergies: No Known Drug Allergies  Physical Exam  General:  Well-developed,well-nourished,in no acute distress; alert,appropriate and cooperative throughout examination Msk:  Moderate swelling above RT knee over Vast Lat tendon knee motion is good not TTP  RT shoulder mild pain on abduction and on IR/ER full motion except tight on back scratch no drop arm BT OK  Left arm shows popeye deformity not TTP good strength gain   Impression & Recommendations:  Problem # 1:  KNEE PAIN, RIGHT (ICD-719.46)  Her updated medication list for this problem includes:    Diclofenac Sodium 50 Mg Tbec (Diclofenac sodium) .Marland Kitchen... Tablet by mouth twice a day    Vicodin 5-500 Mg Tabs (Hydrocodone-acetaminophen) .Marland Kitchen... Take one every 6 hours as needed for  pain  kkep up biking and few SLRs use knee sleeve as needed  partial vast lat tear - no worsening  Problem # 2:  BICEPS TENDON RUPTURE, LEFT (ICD-727.62) definitely imprved use light weight for exercise avoid anything heavy  Problem # 3:  SUBACROMIAL BURSITIS, RIGHT (ICD-726.19) This seems mild now restart her ROM exercises work full ROM before adding any weight  if not improving RTC for injection  Complete Medication List: 1)  Diclofenac Sodium 50 Mg Tbec (Diclofenac sodium) .... Tablet by mouth twice a day 2)  Ritalin  10 Mg Tabs (Methylphenidate hcl) .... Take one tablet twice a day as directed 3)  Hyzaar 100-25 Mg Tabs (Losartan potassium-hctz) .... Take 1 tablet by mouth once a day 4)  Retin-a 0.05 % Crea (Tretinoin) .... Apply 1 a small amount to skin once a day 5)  Rhinocort Aqua 32 Mcg/act Susp (Budesonide (nasal)) .... Inhale 1 spray as directed twice a day 6)  Synthroid 50 Mcg Tabs (Levothyroxine sodium) .... Take 1 tablet by mouth once a day 7)  Vivelle-dot 0.1 Mg/24hr Pttw (Estradiol) .Marland Kitchen.. 1 patch to skin twice a week 8)  Effexor Xr 75 Mg Cp24 (Venlafaxine hcl) .... Take one at bedtime 9)  Vicodin 5-500 Mg Tabs (Hydrocodone-acetaminophen) .... Take one every 6 hours as needed for  pain 10)  Ferrous Sulfate 220 (44 Fe) Mg/52ml Elix (Ferrous sulfate) .Marland Kitchen.. 1 tsp tid 11)  Lidoderm 5 % Ptch (Lidocaine) .... Use as directed for 12 hours daily 12)  Voltaren 1 % Gel (Diclofenac sodium) .Marland Kitchen.. 1 to 2 grams qid as directed

## 2010-06-05 NOTE — Assessment & Plan Note (Signed)
Summary: F/U [facility]   History of Present Illness: 77 yo wf returns for re-eval of right knee pain.  Last seen (?) at which time pain was localized around patella.  Since last visit has been doing stretching exercises for calf and plantar fascia and riding stationary bike.   Stationary bike does not seem to aggravate knee. Prolonged standing/walking aggravate knee and pain has more recently localized to lateral thigh.  Pain is palpable.  No numbness or tingling.  No new weakness. Pain is relieved with use of Voltaren and avoidance of prolonged activity.   Some increase in pain in RT upper thigh after carrying books up to book case  Both shoulders ache but she feels she can get full movement  Left biceps tear is not painful unless she lifts too much  Allergies: No Known Drug Allergies  Physical Exam  General:  Well-developed,well-nourished,in no acute distress; alert,appropriate and cooperative throughout examination Msk:  Loss of transverse arch noted bilaterally with standing.  5cm soft tissue swelling noted distal portion right lateral thigh (superolateral to patella); this swelling is non-tender, non fluctuant and not erythematous.  No palpable defect or tenderness of vastus lateralis.  Full PROM of right knee and hip without pain.  Strength 5/5 with hip flexion, extension, abduction, adduction.  Strength 5/5 with knee flexion, extension.  Strength 5/5 with ankle dorsiflexion, plantarflexion, eversion, inversion.  Positive Trendelenburg sign bilaterally.   Extremities:  popeye mm on left that is painful to testing on speeds Neurologic:  2+ DTRs at patella and achilles bilaterally Additional Exam:  MSK [place]  Tear and increase fluid persists around VL on RT knee there is no large effusion A superior patellar spur is noted at area of tear medial meniscus shows tear but no inc fluid lat meniscus intact spurs both joint lines  upper thigh shows no tear on RT  images saved   Impression  & Recommendations:  Problem # 1:  KNEE PAIN, RIGHT (ICD-719.46)  Her updated medication list for this problem includes:    Diclofenac Sodium 50 Mg Tbec (Diclofenac sodium) .Marland Kitchen... Tablet by mouth twice a day    Vicodin 5-500 Mg Tabs (Hydrocodone-acetaminophen) .Marland Kitchen... Take one every 6 hours as needed for  pain  now using only diclofenac can wean from brace keep up biking  reck 4 mos  Orders: [place] LIMITED ([id number])  Problem # 2:  SUBACROMIAL BURSITIS, RIGHT (ICD-726.19) keep up motion exercises keep up ligth strength work  Problem # 3:  BICEPS TENDON RUPTURE, LEFT (ICD-727.62) this is stable be careful to avoid heavy lifting w left arm  Complete Medication List: 1)  Diclofenac Sodium 50 Mg Tbec (Diclofenac sodium) .... Tablet by mouth twice a day 2)  Ritalin 10 Mg Tabs (Methylphenidate hcl) .... Take one tablet twice a day as directed 3)  Hyzaar 100-25 Mg Tabs (Losartan potassium-hctz) .... Take 1 tablet by mouth once a day 4)  Retin-a 0.05 % Crea (Tretinoin) .... Apply 1 a small amount to skin once a day 5)  Rhinocort Aqua 32 Mcg/act Susp (Budesonide (nasal)) .... Inhale 1 spray as directed twice a day 6)  Synthroid 50 Mcg Tabs (Levothyroxine sodium) .... Take 1 tablet by mouth once a day 7)  Vivelle-dot 0.1 Mg/24hr Pttw (Estradiol) .Marland Kitchen.. 1 patch to skin twice a week 8)  Effexor Xr 75 Mg Cp24 (Venlafaxine hcl) .... Take one at bedtime 9)  Vicodin 5-500 Mg Tabs (Hydrocodone-acetaminophen) .... Take one every 6 hours as needed for  pain 10)  Ferrous  Sulfate 220 (44 Fe) Mg/55ml Elix (Ferrous sulfate) .Marland Kitchen.. 1 tsp tid 11)  Lidoderm 5 % Ptch (Lidocaine) .... Use as directed for 12 hours daily 12)  Voltaren 1 % Gel (Diclofenac sodium) .Marland Kitchen.. 1 to 2 grams qid as directed

## 2010-06-07 NOTE — Assessment & Plan Note (Signed)
Summary: F/U [facility]   Vital Signs:  Patient profile:   77 year old female BP sitting:   134 / 82  Vitals Entered By: Lillia Pauls CMA (April 24, 2010 10:29 AM)  History of Present Illness: Pt presents to clinic for f/u lt shoulder dislocation- this is improving.  Feels weak but getting most of ROM back.   Has knot at rt [facility] joint, not painful.   this has come up recently Hx of bursitis on this side  Rt thigh and knee pain.  Hx of partial Vast lat tear now pain is higher on outer thigh came from bike w too much resistance last week  Allergies: No Known Drug Allergies  Physical Exam  General:  Obese but Well-developed,well-nourished,in no acute distress; alert,appropriate and cooperative throughout examination Msk:  Rt shoulder knot at [facility] joint  nearly full motion of left shoulder at this point  Very prominent tibial tubricles both knees Cystic swelling under vastus lateralis tendon on rt, slight change on lt Full knee flexion and extension rt Neg McMurray's rt  Weak hip abduction on rt  Stronger hip abducion on lt Good quad strength bilat     Impression & Recommendations:  Problem # 1:  CLOSED ANTERIOR DISLOCATION OF HUMERUS (ICD-831.01) cont on motion exercises  add some very light wt strength work  infrmation given  Problem # 2:  SUBACROMIAL BURSITIS, RIGHT (ICD-726.19) I am concerned that there may be some  tear under this w geyser sign on RT AC  RTC to scan both shoulders  Problem # 3:  THIGH PAIN (ICD-729.5) suspect this relates to weakness possibly some glut medius tearting  given exercises with step over and lat step up  reck 4 to 6 wks p this  will reck in 1 mo  '  Complete Medication List: 1)  Diclofenac Sodium 50 Mg Tbec (Diclofenac sodium) .... Tablet by mouth twice a day 2)  Ritalin 10 Mg Tabs (Methylphenidate hcl) .... Take one tablet twice a day as directed 3)  Hyzaar 100-25 Mg Tabs (Losartan potassium-hctz) .... Take 1 tablet  by mouth once a day 4)  Retin-a 0.05 % Crea (Tretinoin) .... Apply 1 a small amount to skin once a day 5)  Rhinocort Aqua 32 Mcg/act Susp (Budesonide (nasal)) .... Inhale 1 spray as directed twice a day 6)  Synthroid 50 Mcg Tabs (Levothyroxine sodium) .... Take 1 tablet by mouth once a day 7)  Vivelle-dot 0.1 Mg/24hr Pttw (Estradiol) .Marland Kitchen.. 1 patch to skin twice a week 8)  Effexor Xr 75 Mg Cp24 (Venlafaxine hcl) .... Take one at bedtime 9)  Vicodin 5-500 Mg Tabs (Hydrocodone-acetaminophen) .... Take one every 6 hours as needed for  pain 10)  Ferrous Sulfate 220 (44 Fe) Mg/15ml Elix (Ferrous sulfate) .Marland Kitchen.. 1 tsp tid 11)  Lidoderm 5 % Ptch (Lidocaine) .... Use as directed for 12 hours daily 12)  Voltaren 1 % Gel (Diclofenac sodium) .Marland Kitchen.. 1 to 2 grams qid as directed 13)  Meloxicam 7.5 Mg Tabs (Meloxicam) .... Take 1 by mouth two times a day  Patient Instructions: 1)  Lt shoulder- start using a 1 lb weight and raising in different positions- follow handouts given by Dr. Darrick Penna 2)  For leg- do standing side leg lifts 2 sets of 15 Prescriptions: MELOXICAM 7.5 MG TABS (MELOXICAM) take 1 by mouth two times a day  #60 x 3   Entered and Authorized by:   Enid Baas MD   Signed by:   Enid Baas MD  on 04/24/2010   Method used:   Print then Give to Patient   RxID:   [id number]    Orders Added: 1)  Est. Patient Level IV [21308]

## 2010-06-07 NOTE — Assessment & Plan Note (Signed)
Summary: U/S SHOULDERS PER FIELDS,[facility]   Vital Signs:  Patient profile:   77 year old female BP sitting:   145 / 77  Vitals Entered By: Rochele Pages RN (May 15, 2010 2:07 PM)  CC:  shoulder u/s.  History of Present Illness: 77yo female to office for u/s of b/l shoulders & f/u of R leg pain. s/p L shoulder dislocation 03/2010.  Still with some pain, but improving.  Has improved ROM.  Has not been doing home exercises for last week, attempted exercises with weight after last office visit & had increased pain.  Denies any numbness/tingling. Last office visit noted to have knot over Rt AC-joint concerning for Geyser sign associated with possible RTC tear.  Has been using Rt shoulder more often since dislocating other shoulder.  Knot has decreased in size since last visit.  Not having any pain.  Has good ROM & strength.    Continues to have pain in Rt lateral thigh, some mild lateral knee pain.  No swelling.  Tried using Body-Helix sleeve, but too loose & was falling down.  Using ace wrap which helps.  Some pain with activity.  Hx of partial vastus lateralis tear.  Allergies: No Known Drug Allergies PMH-FH-SH reviewed for relevance  Review of Systems       per HIP, otherwise ROS negative  Physical Exam  General:  Well-developed,well-nourished,in no acute distress; alert,appropriate and cooperative throughout examination Msk:  SHOULDER:   - L shoulder: no bruising or erythema.  Near full ROM with minimal pain.  +4/5 RTC strength with abduction & external rotation, 5-/5 internal rotation. Neg drop arm.  (+)empty can.  (+)pop-eye muscle c/w previous bicep rupture - R shoulder:  no bruising or erythema.  FROM without pain.  Small tender nodule over AC-joint.  (+)empty can, (-)impingement testing, (-)drop arm.  RTC strength +4/5 abduction, 5/5 ER & IR - normal C-spine ROM without pain.  No midline or paraspinal tenderness.  LEG: - R leg: no swelling, erythema, bruising.  Mild TTP along  vastus lateralis.  FROM of knee without pain.  No tenderness or instability of the knee.  Normal quad & hamstring strength.  Noted to have abduction & mild adduction weakness at the hip, normal hip flexion & extension strength.  No pain with hip ROM, neg log roll. - L leg: no tenderness.  FROM of hip & knee without pain.  No weakness of the hip or quads/hamstrings. Pulses:  +2/4 radial b/l Neurologic:  sensation intact to light touch.   Additional Exam:  MSK U/S:  - L shoulder:  biceps tendon with small fluid collection - less fluid than previous exam.  Subscapularis without signs of tear.  Supraspinatus without signs of tear, large calcification noted near tendon insertion.  Small hypoechoic area at Infraspinatus, no signs of tear.  AC joint with moderate effusion & spurring.  Images saved. - R shoulder: poor visualization of bicep within bicipital groove, small calcification within bicep tendon.  Small calcifications within subscapularis & supraspinatus, no signs of tear.  Infraspinatus without signs of tear.  Large hypoechoic area at AC-joint c/w ganglion cyst.  Spurring noted at AC-joint.  Images saved     Impression & Recommendations:  Problem # 1:  CLOSED ANTERIOR DISLOCATION OF HUMERUS (ICD-831.01) Assessment Improved  - s/p dislocation of L shoulder 03/2010.  Repeat u/s today showing no sign of RTC tear. - Improved ROM & strength - Cont. HEP - cont. doing exercises with light resistance.  Emphasized need to cont.  ROM exercises - f/u 4-6 weeks  Orders: [place] LIMITED ([id number])  Problem # 2:  SHOULDER PAIN, RIGHT (ICD-719.41) Assessment: Improved  - R-shoulder pain improved - MSK u/s showing ganglion cyst at AC-joint, no signs of RTC tear. - Cont. HEP - ok to use light resistance. - If pain persists could consider possible aspiraration of cyst - f/u 4-6 weeks  Her updated medication list for this problem includes:    Diclofenac Sodium 50 Mg Tbec (Diclofenac sodium) .Marland Kitchen... Tablet by  mouth twice a day    Vicodin 5-500 Mg Tabs (Hydrocodone-acetaminophen) .Marland Kitchen... Take one every 6 hours as needed for  pain    Meloxicam 7.5 Mg Tabs (Meloxicam) .Marland Kitchen... Take 1 by mouth two times a day  Orders: [place] LIMITED ([id number])  Problem # 3:  THIGH PAIN (ICD-729.5) Assessment: Improved  - Fitted with new Body-Helix sleeve that was better fitting - Shown home exercises to focus on hip strength - reassured that should get better over time with use of sleeve & exercises - cont. Mobic prn - will reassess in 4-6 weeks.    Orders: [place] LIMITED ([id number])  Complete Medication List: 1)  Diclofenac Sodium 50 Mg Tbec (Diclofenac sodium) .... Tablet by mouth twice a day 2)  Ritalin 10 Mg Tabs (Methylphenidate hcl) .... Take one tablet twice a day as directed 3)  Hyzaar 100-25 Mg Tabs (Losartan potassium-hctz) .... Take 1 tablet by mouth once a day 4)  Retin-a 0.05 % Crea (Tretinoin) .... Apply 1 a small amount to skin once a day 5)  Rhinocort Aqua 32 Mcg/act Susp (Budesonide (nasal)) .... Inhale 1 spray as directed twice a day 6)  Synthroid 50 Mcg Tabs (Levothyroxine sodium) .... Take 1 tablet by mouth once a day 7)  Vivelle-dot 0.1 Mg/24hr Pttw (Estradiol) .Marland Kitchen.. 1 patch to skin twice a week 8)  Effexor Xr 75 Mg Cp24 (Venlafaxine hcl) .... Take one at bedtime 9)  Vicodin 5-500 Mg Tabs (Hydrocodone-acetaminophen) .... Take one every 6 hours as needed for  pain 10)  Ferrous Sulfate 220 (44 Fe) Mg/54ml Elix (Ferrous sulfate) .Marland Kitchen.. 1 tsp tid 11)  Lidoderm 5 % Ptch (Lidocaine) .... Use as directed for 12 hours daily 12)  Voltaren 1 % Gel (Diclofenac sodium) .Marland Kitchen.. 1 to 2 grams qid as directed 13)  Meloxicam 7.5 Mg Tabs (Meloxicam) .... Take 1 by mouth two times a day   Orders Added: 1)  Est. Patient Level III [91478] 2)  [place] LIMITED [[id number]]

## 2010-06-13 NOTE — Assessment & Plan Note (Signed)
Summary: F/U,[facility]   Vital Signs:  Patient profile:   77 year old female BP sitting:   154 / 91  Vitals Entered By: Lillia Pauls CMA (June 05, 2010 11:00 AM)  CC:  shoulder weakness.  History of Present Illness: L shoulder weakness- had shoulder dislocation in 11/11.  no pain unless has to exert herself.  Is doing ROM exercises, however has not done any strength training because she is afraid to overdo.  has noticed significant weakness and is unable to hold anything over her head.  would like to work on increasing strength.  R shoulder pain- bursitis, doing her exercises when she has pain which will calm it down, feels this is under control now.  using voltaren and tylenol for pain  Current Medications (verified): 1)  Diclofenac Sodium 50 Mg  Tbec (Diclofenac Sodium) .... Tablet By Mouth Twice A Day 2)  Ritalin 10 Mg  Tabs (Methylphenidate Hcl) .... Take One Tablet Twice A Day As Directed 3)  Hyzaar 100-25 Mg Tabs (Losartan Potassium-Hctz) .... Take 1 Tablet By Mouth Once A Day 4)  Retin-A 0.05 % Crea (Tretinoin) .... Apply 1 A Small Amount To Skin Once A Day 5)  Rhinocort Aqua 32 Mcg/act Susp (Budesonide (Nasal)) .... Inhale 1 Spray As Directed Twice A Day 6)  Synthroid 50 Mcg Tabs (Levothyroxine Sodium) .... Take 1 Tablet By Mouth Once A Day 7)  Vivelle-Dot 0.1 Mg/24hr Pttw (Estradiol) .Marland Kitchen.. 1 Patch To Skin Twice A Week 8)  Effexor Xr 75 Mg  Cp24 (Venlafaxine Hcl) .... Take One At Bedtime 9)  Vicodin 5-500 Mg  Tabs (Hydrocodone-Acetaminophen) .... Take One Every 6 Hours As Needed For  Pain 10)  Ferrous Sulfate 220 (44 Fe) Mg/3ml  Elix (Ferrous Sulfate) .Marland Kitchen.. 1 Tsp Tid 11)  Lidoderm 5 % Ptch (Lidocaine) .... Use As Directed For 12 Hours Daily 12)  Voltaren 1 % Gel (Diclofenac Sodium) .Marland Kitchen.. 1 To 2 Grams Qid As Directed 13)  Meloxicam 7.5 Mg Tabs (Meloxicam) .... Take 1 By Mouth Two Times A Day  Allergies (verified): No Known Drug Allergies  Review of Systems  The patient denies  anorexia, fever, and weight loss.    Physical Exam  General:  Well-developed,well-nourished,in no acute distress; alert,appropriate and cooperative throughout examination Msk:  SHOULDER:   - L shoulder: no bruising or erythema.  Near full ROM with minimal pain.  +4/5 RTC strength with abduction & external rotation, 5-/5 internal rotation. Neg drop arm.  (+)empty can.  (+)pop-eye muscle c/w previous bicep rupture - R shoulder:  no bruising or erythema.  FROM without pain.    (+)empty can, (-)impingement testing, (-)drop arm.  RTC strength +4/5 abduction, 5/5 ER & IR - normal C-spine ROM without pain. neg spurling.  No midline or paraspinal tenderness.    Impression & Recommendations:  Problem # 1:  SHOULDER PAIN, RIGHT (ICD-719.41) Assessment Unchanged ganglion cyst resolved.  no pain, some limitation of strength.  do 6 reps of exercise with light weight  Her updated medication list for this problem includes:    Diclofenac Sodium 50 Mg Tbec (Diclofenac sodium) .Marland Kitchen... Tablet by mouth twice a day    Vicodin 5-500 Mg Tabs (Hydrocodone-acetaminophen) .Marland Kitchen... Take one every 6 hours as needed for  pain    Meloxicam 7.5 Mg Tabs (Meloxicam) .Marland Kitchen... Take 1 by mouth two times a day  Problem # 2:  SHOULDER PAIN, LEFT (ICD-719.41) Assessment: Improved full ROM but weak.  will start with lightest theraband and 6 reps.  gave shoulder exercises for her to do daily. continue voltaren, tylenol.  RTC 4-6 weeks. Her updated medication list for this problem includes:    Diclofenac Sodium 50 Mg Tbec (Diclofenac sodium) .Marland Kitchen... Tablet by mouth twice a day    Vicodin 5-500 Mg Tabs (Hydrocodone-acetaminophen) .Marland Kitchen... Take one every 6 hours as needed for  pain    Meloxicam 7.5 Mg Tabs (Meloxicam) .Marland Kitchen... Take 1 by mouth two times a day  Complete Medication List: 1)  Diclofenac Sodium 50 Mg Tbec (Diclofenac sodium) .... Tablet by mouth twice a day 2)  Ritalin 10 Mg Tabs (Methylphenidate hcl) .... Take one tablet twice a  day as directed 3)  Hyzaar 100-25 Mg Tabs (Losartan potassium-hctz) .... Take 1 tablet by mouth once a day 4)  Retin-a 0.05 % Crea (Tretinoin) .... Apply 1 a small amount to skin once a day 5)  Rhinocort Aqua 32 Mcg/act Susp (Budesonide (nasal)) .... Inhale 1 spray as directed twice a day 6)  Synthroid 50 Mcg Tabs (Levothyroxine sodium) .... Take 1 tablet by mouth once a day 7)  Vivelle-dot 0.1 Mg/24hr Pttw (Estradiol) .Marland Kitchen.. 1 patch to skin twice a week 8)  Effexor Xr 75 Mg Cp24 (Venlafaxine hcl) .... Take one at bedtime 9)  Vicodin 5-500 Mg Tabs (Hydrocodone-acetaminophen) .... Take one every 6 hours as needed for  pain 10)  Ferrous Sulfate 220 (44 Fe) Mg/56ml Elix (Ferrous sulfate) .Marland Kitchen.. 1 tsp tid 11)  Lidoderm 5 % Ptch (Lidocaine) .... Use as directed for 12 hours daily 12)  Voltaren 1 % Gel (Diclofenac sodium) .Marland Kitchen.. 1 to 2 grams qid as directed 13)  Meloxicam 7.5 Mg Tabs (Meloxicam) .... Take 1 by mouth two times a day   Orders Added: 1)  Est. Patient Level III [16109]

## 2010-07-17 ENCOUNTER — Encounter: Payer: Self-pay | Admitting: Sports Medicine

## 2010-07-17 ENCOUNTER — Ambulatory Visit (INDEPENDENT_AMBULATORY_CARE_PROVIDER_SITE_OTHER): Payer: Medicare Other | Admitting: Sports Medicine

## 2010-07-17 ENCOUNTER — Encounter: Payer: Self-pay | Admitting: Home Health Services

## 2010-07-17 DIAGNOSIS — M25519 Pain in unspecified shoulder: Secondary | ICD-10-CM

## 2010-07-17 DIAGNOSIS — S43016A Anterior dislocation of unspecified humerus, initial encounter: Secondary | ICD-10-CM

## 2010-07-24 NOTE — Assessment & Plan Note (Signed)
Summary: F/U [facility]   Vital Signs:  Patient profile:   77 year old female Pulse rate:   57 / minute BP sitting:   149 / 79  (right arm)  Vitals Entered By: Rochele Pages RN (July 17, 2010 11:25 AM) CC: f/u lt shoulder    CC:  f/u lt shoulder .  History of Present Illness: Ms. Schoon is a 77 yo who presents for a F/U appointment s/p L shoulder dislocation & reduction in November, 2011.  Patient has been performing home exercises, however has not been able to add weight to any exercises because it is too painful.  She considers herself to be 70% improved since her initial injury.  Now able to lift her left arm above her head, however she struggles when holding a hair dryer.  Patient admits to having difficulty while trying to lift objects.  She takes Voltaren two times a day by mouth and it helps relieve her pain.   This shoulder had experienced a bicipital rupture prior to the current injury   Preventive Screening-Counseling & Management  Alcohol-Tobacco     Smoking Status: never  Allergies (verified): No Known Drug Allergies  Physical Exam  General:  alert, healthy-appearing, and cooperative to examination, NAD   Msk:  L shoulder:  Inspection reveals asymmetry due to L biceps long head rupture and some decreased contour of L infraspinatus. No bruising or erythema.   Near full ROM with minimal pain.   TTP of bicepetal groove +4/5 external rotation, 4/5 internal rotation. (+)empty can.   now is able to fully elevate left arm weakness in certain positions for ER is biggest limitaiton has pretty good strength for RC in position at waist level still with pain on Speeds        Impression & Recommendations:  Problem # 1:  SHOULDER PAIN, LEFT (ICD-719.41) Patient shows signs of improvement since her last visit.  Continue with home exercise program, use theraband and progress to using weights if tolerated. Continue with home stretching to help maintain ROM. Voltaren  two times a day by mouth for pain. RTC 2- 3 mos  Problem # 2:  CLOSED ANTERIOR DISLOCATION OF HUMERUS (ICD-831.01) This has recovered and not left her with permanent deficit in motion  note she is weak in ER and possibility of partial tear to infraspinatus should be considered  On RTC we should repeat her shoulder scan  Complete Medication List: 1)  Diclofenac Sodium 50 Mg Tbec (Diclofenac sodium) .... Tablet by mouth twice a day 2)  Ritalin 10 Mg Tabs (Methylphenidate hcl) .... Take one tablet twice a day as directed 3)  Hyzaar 100-25 Mg Tabs (Losartan potassium-hctz) .... Take 1 tablet by mouth once a day 4)  Retin-a 0.05 % Crea (Tretinoin) .... Apply 1 a small amount to skin once a day 5)  Rhinocort Aqua 32 Mcg/act Susp (Budesonide (nasal)) .... Inhale 1 spray as directed twice a day 6)  Synthroid 50 Mcg Tabs (Levothyroxine sodium) .... Take 1 tablet by mouth once a day 7)  Vivelle-dot 0.1 Mg/24hr Pttw (Estradiol) .Marland Kitchen.. 1 patch to skin twice a week 8)  Effexor Xr 75 Mg Cp24 (Venlafaxine hcl) .... Take one at bedtime 9)  Vicodin 5-500 Mg Tabs (Hydrocodone-acetaminophen) .... Take one every 6 hours as needed for  pain 10)  Ferrous Sulfate 220 (44 Fe) Mg/42ml Elix (Ferrous sulfate) .Marland Kitchen.. 1 tsp tid 11)  Lidoderm 5 % Ptch (Lidocaine) .... Use as directed for 12 hours daily 12)  Voltaren  1 % Gel (Diclofenac sodium) .Marland Kitchen.. 1 to 2 grams qid as directed 13)  Meloxicam 7.5 Mg Tabs (Meloxicam) .... Take 1 by mouth two times a day   Orders Added: 1)  Est. Patient Level III [04540]

## 2010-09-21 NOTE — Op Note (Signed)
NAME:  Tara Mack, Tara Mack                  ACCOUNT NO.:  [id number]   MEDICAL RECORD NO.:  [id number]          PATIENT TYPE:  AMB   LOCATION:  ENDO                         FACILITY:  [facility]   PHYSICIAN:  John C. Madilyn Fireman, M.D.    DATE OF BIRTH:  06-29-1930   DATE OF PROCEDURE:  04/21/2007  DATE OF DISCHARGE:                               OPERATIVE REPORT   PROCEDURE:  Small bowel capsule endoscopy.   REASON FOR REFERRAL:  Anemia and heme-positive stool with negative  colonoscopy and EGD.   PROCEDURAL DATA:  Gastric passage time 10 minutes, small bowel passage  time 3 hours and 40 minutes.   FINDINGS:  Normal small bowel with no active bleeding, and no blood seen  within the intestinal lumen.   PLAN:  Monitor stools and hemoglobin and pursue further workup of blood  loss as indicated.           ______________________________  Everardo All. Madilyn Fireman, M.D.     [facility]  D:  04/21/2007  T:  04/21/2007  Job:  [id number]   cc:   Bernette Redbird, M.D.  Fax: [phone]

## 2011-05-29 ENCOUNTER — Other Ambulatory Visit: Payer: Self-pay | Admitting: Geriatric Medicine

## 2011-05-29 DIAGNOSIS — Z1231 Encounter for screening mammogram for malignant neoplasm of breast: Secondary | ICD-10-CM

## 2011-05-29 DIAGNOSIS — Z9011 Acquired absence of right breast and nipple: Secondary | ICD-10-CM

## 2011-06-26 ENCOUNTER — Ambulatory Visit: Payer: Medicare Other

## 2011-06-28 ENCOUNTER — Ambulatory Visit
Admission: RE | Admit: 2011-06-28 | Discharge: 2011-06-28 | Disposition: A | Discharge status: Home / Self Care | Payer: Medicare Other | Source: Ambulatory Visit | Attending: Geriatric Medicine | Admitting: Geriatric Medicine

## 2011-06-28 DIAGNOSIS — Z9011 Acquired absence of right breast and nipple: Secondary | ICD-10-CM

## 2011-06-28 DIAGNOSIS — Z1231 Encounter for screening mammogram for malignant neoplasm of breast: Secondary | ICD-10-CM

## 2011-08-14 ENCOUNTER — Ambulatory Visit
Admission: RE | Admit: 2011-08-14 | Discharge: 2011-08-14 | Disposition: A | Discharge status: Home / Self Care | Payer: Medicare Other | Source: Ambulatory Visit | Attending: Geriatric Medicine | Admitting: Geriatric Medicine

## 2011-08-14 ENCOUNTER — Other Ambulatory Visit: Payer: Self-pay | Admitting: Geriatric Medicine

## 2011-08-14 DIAGNOSIS — R609 Edema, unspecified: Secondary | ICD-10-CM

## 2011-11-05 ENCOUNTER — Ambulatory Visit (INDEPENDENT_AMBULATORY_CARE_PROVIDER_SITE_OTHER): Payer: Medicare Other | Admitting: Sports Medicine

## 2011-11-05 VITALS — BP 146/81

## 2011-11-05 DIAGNOSIS — R29818 Other symptoms and signs involving the nervous system: Secondary | ICD-10-CM

## 2011-11-05 DIAGNOSIS — R296 Repeated falls: Secondary | ICD-10-CM

## 2011-11-05 DIAGNOSIS — R5383 Other fatigue: Secondary | ICD-10-CM

## 2011-11-05 DIAGNOSIS — E039 Hypothyroidism, unspecified: Secondary | ICD-10-CM

## 2011-11-05 DIAGNOSIS — M79609 Pain in unspecified limb: Secondary | ICD-10-CM

## 2011-11-05 DIAGNOSIS — M79672 Pain in left foot: Secondary | ICD-10-CM | POA: Insufficient documentation

## 2011-11-05 DIAGNOSIS — G5760 Lesion of plantar nerve, unspecified lower limb: Secondary | ICD-10-CM

## 2011-11-05 DIAGNOSIS — R5381 Other malaise: Secondary | ICD-10-CM

## 2011-11-05 LAB — CBC
Hemoglobin: 13.9 g/dL (ref 12.0–15.0)
MCH: 33 pg (ref 26.0–34.0)
MCHC: 35.1 g/dL (ref 30.0–36.0)
MCV: 94.1 fL (ref 78.0–100.0)
Platelets: 236 10*3/uL (ref 150–400)
RBC: 4.21 MIL/uL (ref 3.87–5.11)
RDW: 12.9 % (ref 11.5–15.5)
WBC: 7.4 10*3/uL (ref 4.0–10.5)

## 2011-11-05 LAB — TSH: TSH: 3.324 u[IU]/mL (ref 0.350–4.500)

## 2011-11-05 NOTE — Assessment & Plan Note (Signed)
Patient complains of fatigue- has not had TSH checked in >6 months, will order TSH to ensure she is on correct dose of Synthroid.

## 2011-11-05 NOTE — Assessment & Plan Note (Signed)
Suspect falls are partially related to foot pain as well as poor core strength and balance.  Gave standing core exercises, instructed to slowly increase as tolerated.

## 2011-11-05 NOTE — Assessment & Plan Note (Signed)
Metatarsal pads placed in shoes. If she improves will place in other shoes.  Patient given standing core exercises to help with balance.

## 2011-11-05 NOTE — Assessment & Plan Note (Addendum)
Will check TSH and CBC to look for causes of fatigue.   Send information to Dr Pete Glatter after results

## 2011-11-05 NOTE — Patient Instructions (Addendum)
Please wear the shoes with the pads for your transverse arch.  Wear them for a week, and if they feel good, bring several pairs of shoes by the office with the sandals and we will place pads in other shoes.    Please do the standing exercises- stand on one foot and hold the wall.  Start slow with 2 sets of 6 repetitions, and slowly increase repetitions and number of sets.   Continue your diclofenac and tylenol as needed.   Please return in 4-6 weeks

## 2011-11-05 NOTE — Progress Notes (Signed)
  Subjective:    Patient ID: Tara Mack, female    DOB: 01-07-1931, 78 y.o.   MRN: [id number]  HPI  Brindy comes in for pain in both her feet.  She has difficulty saying exactly how long this has been bothering her, but she says at least 6 months.  She says the pain is on the top of her foot, unlike her plantar fascitis that was on the bottom.  She says that if she goes to the grocery store by the time she is done she is hobbling because her feet hurt so badly.    She has had 4 falls in the past year that she attributes to bad balance- denies any light-headedness or blacking out with falls.  Says she just was trying to move to quickly and lost her balance.    She also complains of fatigue that has bothered her for a long time.  She says she feels tired all the time, and she falls asleep in her chair in the evenings watching TV.  Hx of hypothyroidsim and on synrhoid 50 mcg Also takes effexor  Review of Systems See HPI.     Objective:   Physical Exam BP 146/81 General appearance: alert, cooperative and no distress Ankle and Foot: No visible erythema or swelling. However patient has several bruises on toes.  Range of motion is full in all directions. Strength is 5/5 in all directions, ligaments stable Squeeze test is painful bilaterally + pain at right great toe metatarsal joint No pain at base of 5th MT; No tenderness over cuboid; No tenderness over N spot or navicular prominence No tenderness on posterior aspects of lateral and medial malleolus No sign of peroneal tendon subluxations; Able to walk 4 steps. Standing Exam: Right foot with loss of long and transverse arch, toes 3-5 with hammer toe Left foot loss of long and transverse arch, splaying of toes 2 and 3.  Balance: Romberg negative.  Patient unable to stand on either leg for more than 3 seconds unassisted.       Assessment & Plan:

## 2011-11-05 NOTE — Assessment & Plan Note (Addendum)
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  Possible component of both.   We will use MT pads and after placing on her shoes this relieved some of pain  If this works over next week, we need to add to other shoes

## 2011-11-06 ENCOUNTER — Telehealth: Payer: Self-pay | Admitting: *Deleted

## 2011-11-06 NOTE — Telephone Encounter (Signed)
Called pt w/ normal lab results per Dr. Darrick Penna. CBC and TSH normal.

## 2013-05-11 ENCOUNTER — Encounter: Payer: Self-pay | Admitting: Cardiology

## 2013-05-11 ENCOUNTER — Ambulatory Visit (HOSPITAL_COMMUNITY): Payer: Medicare Other | Attending: Cardiology | Admitting: Radiology

## 2013-05-11 ENCOUNTER — Other Ambulatory Visit (HOSPITAL_COMMUNITY): Payer: Self-pay | Admitting: Radiology

## 2013-05-11 DIAGNOSIS — I350 Nonrheumatic aortic (valve) stenosis: Secondary | ICD-10-CM

## 2013-05-11 DIAGNOSIS — I379 Nonrheumatic pulmonary valve disorder, unspecified: Secondary | ICD-10-CM | POA: Insufficient documentation

## 2013-05-11 DIAGNOSIS — I059 Rheumatic mitral valve disease, unspecified: Secondary | ICD-10-CM | POA: Insufficient documentation

## 2013-05-11 DIAGNOSIS — I359 Nonrheumatic aortic valve disorder, unspecified: Secondary | ICD-10-CM

## 2013-05-11 DIAGNOSIS — E785 Hyperlipidemia, unspecified: Secondary | ICD-10-CM | POA: Insufficient documentation

## 2013-05-11 DIAGNOSIS — I4891 Unspecified atrial fibrillation: Secondary | ICD-10-CM | POA: Insufficient documentation

## 2013-05-11 DIAGNOSIS — I10 Essential (primary) hypertension: Secondary | ICD-10-CM | POA: Insufficient documentation

## 2013-05-11 NOTE — Progress Notes (Signed)
Echocardiogram performed.  

## 2013-06-21 ENCOUNTER — Encounter: Payer: Self-pay | Admitting: Cardiology

## 2013-06-21 ENCOUNTER — Encounter: Payer: Self-pay | Admitting: *Deleted

## 2013-06-24 ENCOUNTER — Encounter: Payer: Self-pay | Admitting: Cardiology

## 2013-06-24 ENCOUNTER — Ambulatory Visit (INDEPENDENT_AMBULATORY_CARE_PROVIDER_SITE_OTHER): Payer: Medicare Other | Admitting: Cardiology

## 2013-06-24 VITALS — BP 144/90 | HR 92 | Ht 63.0 in | Wt 192.8 lb

## 2013-06-24 DIAGNOSIS — I350 Nonrheumatic aortic (valve) stenosis: Secondary | ICD-10-CM

## 2013-06-24 DIAGNOSIS — I10 Essential (primary) hypertension: Secondary | ICD-10-CM

## 2013-06-24 DIAGNOSIS — E785 Hyperlipidemia, unspecified: Secondary | ICD-10-CM

## 2013-06-24 DIAGNOSIS — I359 Nonrheumatic aortic valve disorder, unspecified: Secondary | ICD-10-CM

## 2013-06-24 DIAGNOSIS — I4891 Unspecified atrial fibrillation: Secondary | ICD-10-CM

## 2013-06-24 HISTORY — DX: Nonrheumatic aortic (valve) stenosis: I35.0

## 2013-06-24 LAB — CBC WITH DIFFERENTIAL/PLATELET
Basophils Absolute: 0 10*3/uL (ref 0.0–0.1)
Basophils Relative: 0.3 % (ref 0.0–3.0)
Eosinophils Absolute: 0.4 10*3/uL (ref 0.0–0.7)
Eosinophils Relative: 5 % (ref 0.0–5.0)
HCT: 41 % (ref 36.0–46.0)
Hemoglobin: 13.3 g/dL (ref 12.0–15.0)
Lymphocytes Relative: 12.2 % (ref 12.0–46.0)
Lymphs Abs: 1 10*3/uL (ref 0.7–4.0)
MCHC: 32.5 g/dL (ref 30.0–36.0)
MCV: 101.2 fl — ABNORMAL HIGH (ref 78.0–100.0)
Monocytes Absolute: 0.7 10*3/uL (ref 0.1–1.0)
Monocytes Relative: 8.2 % (ref 3.0–12.0)
Neutro Abs: 6.1 10*3/uL (ref 1.4–7.7)
Neutrophils Relative %: 74.3 % (ref 43.0–77.0)
Platelets: 221 10*3/uL (ref 150.0–400.0)
RBC: 4.05 Mil/uL (ref 3.87–5.11)
RDW: 14.6 % (ref 11.5–14.6)
WBC: 8.2 10*3/uL (ref 4.5–10.5)

## 2013-06-24 LAB — BASIC METABOLIC PANEL
BUN: 18 mg/dL (ref 6–23)
CO2: 25 mEq/L (ref 19–32)
Calcium: 8.9 mg/dL (ref 8.4–10.5)
Chloride: 99 mEq/L (ref 96–112)
Creatinine, Ser: 0.7 mg/dL (ref 0.4–1.2)
GFR: 84.15 mL/min (ref >60.00)
Glucose, Bld: 92 mg/dL (ref 70–99)
Potassium: 4.1 mEq/L (ref 3.5–5.1)
Sodium: 134 mEq/L — ABNORMAL LOW (ref 135–145)

## 2013-06-24 MED ORDER — DILTIAZEM HCL ER COATED BEADS 180 MG PO CP24: 180.0000 mg | ORAL_CAPSULE | Freq: Every day | ORAL | Status: DC

## 2013-06-24 NOTE — Assessment & Plan Note (Signed)
Patient presents with atrial fibrillation. She is asymptomatic with no change in dyspnea or energy. Her rate is upper normal. Increase Cardizem to 180 mg daily. Multiple embolic risk factors including age greater than 27, hypertension and female sex. Continue apixaban. Check hemoglobin and renal function. Recent echocardiogram shows normal LV function. Obtain most recent TSH from primary care. Long discussion today concerning options for therapy. This includes rate control/anticoagulation versus rhythm control. Given embolic risk factors she will always require anticoagulation. Her left atrium is significantly enlarged. Given that she is asymptomatic I would therefore favor rate control and anticoagulation and she agrees.

## 2013-06-24 NOTE — Assessment & Plan Note (Signed)
Continue statin. Management per primary care.

## 2013-06-24 NOTE — Progress Notes (Signed)
HPI: 80 year old female for evaluation of atrial fibrillation. Echocardiogram in January of 2015 showed normal LV function, moderate to severe left atrial enlargement, mild right atrial enlargement and mildly reduced RV function. The aortic valves were severely thickened. There was mild aortic stenosis by Doppler with a mean gradient of 18 mm of mercury. Patient apparently had transient atrial fibrillation some years ago in the setting of higher synthroid doses. She was recently seen by her primary care physician for a routine examination and was found to be in atrial fibrillation and we were asked to evaluate. She notes some dyspnea on exertion but this is unchanged. No orthopnea, PND, pedal edema, palpitations, syncope or chest pain.   Current Outpatient Prescriptions  Medication Sig Dispense Refill  . CRESTOR 5 MG tablet       . diclofenac sodium (VOLTAREN) 1 % GEL Apply 1-2 g topically 4 (four) times daily. as directed      . diltiazem (CARDIZEM CD) 120 MG 24 hr capsule       . ELIQUIS 5 MG TABS tablet       . estradiol (VIVELLE-DOT) 0.1 MG/24HR Place 1 patch onto the skin twice a week.        . fluticasone (FLONASE) 50 MCG/ACT nasal spray       . furosemide (LASIX) 20 MG tablet Take 20 mg by mouth 2 (two) times daily.      Marland Kitchen levothyroxine (SYNTHROID, LEVOTHROID) 50 MCG tablet Take 50 mcg by mouth daily.        Marland Kitchen losartan (COZAAR) 100 MG tablet Take 100 mg by mouth daily.      Marland Kitchen tretinoin (RETIN-A) 0.05 % cream Apply a small amount to skin daily      . venlafaxine (EFFEXOR-XR) 75 MG 24 hr capsule Take 75 mg by mouth at bedtime.         No current facility-administered medications for this visit.    No Known Allergies  Past Medical History  Diagnosis Date  . FIBRILLATION, ATRIAL   . HYPERLIPIDEMIA   . HYPERTENSION, BENIGN SYSTEMIC   . ANEMIA NOS   . BICEPS TENDON RUPTURE, LEFT   . Closed anterior dislocation of humerus   . DEPRESSION, MAJOR, RECURRENT   . FATIGUE, CHRONIC     . HYPOTHYROIDISM NOS   . OBESITY NOS   . OSTEOARTHRITIS, MULTI SITES   . Rosacea   . SUBACROMIAL BURSITIS, RIGHT   . Breast cancer     Past Surgical History  Procedure Laterality Date  . Mastectomy Right   . Tonsillectomy    . Abdominal hysterectomy      History   Social History  . Marital Status: Married    Spouse Name: N/A    Number of Children: 2  . Years of Education: N/A   Occupational History  .     Social History Main Topics  . Smoking status: Never Smoker   . Smokeless tobacco: Not on file  . Alcohol Use: Yes     Comment: 3 drinks per night  . Drug Use: Not on file  . Sexual Activity: Not on file   Other Topics Concern  . Not on file   Social History Narrative  . No narrative on file    Family History  Problem Relation Age of Onset  . CAD Mother     ROS: no fevers or chills, productive cough, hemoptysis, dysphasia, odynophagia, melena, hematochezia, dysuria, hematuria, rash, seizure activity, orthopnea, PND, pedal edema, claudication. Remaining systems  are negative.  Physical Exam:   Blood pressure 144/90, pulse 92, height 5\' 3"  (1.6 m), weight 192 lb 12.8 oz (87.454 kg).  General:  Well developed/obese in NAD Skin warm/dry Patient not depressed No peripheral clubbing Back-normal HEENT-normal/normal eyelids Neck supple/normal carotid upstroke bilaterally; no bruits; no JVD; no thyromegaly chest - CTA/ normal expansion CV - irregular/normal S1 and S2; no rubs or gallops;  PMI nondisplaced, 3/6 systolic murmur left sternal border. S2 is not diminished. Abdomen -NT/ND, no HSM, no mass, + bowel sounds, no bruit 2+ femoral pulses, no bruits Ext-no edema, chords, 2+ DP Neuro-grossly nonfocal  ECG atrial fibrillation at a rate of 92. Left axis deviation. Cannot rule out prior anterior infarct. Low voltage.

## 2013-06-24 NOTE — Patient Instructions (Signed)
Your physician recommends that you schedule a follow-up appointment in: [place]  Your physician recommends that you HAVE LAB WORK TODAY  INCREASE DILTIAZEM TO 180 MG ONCE DAILY  Your physician has recommended that you wear a 24 HOUR holter monitor. Holter monitors are medical devices that record the heart's electrical activity. Doctors most often use these monitors to diagnose arrhythmias. Arrhythmias are problems with the speed or rhythm of the heartbeat. The monitor is a small, portable device. You can wear one while you do your normal daily activities. This is usually used to diagnose what is causing palpitations/syncope (passing out). SCHEDULE IN ONE WEEK

## 2013-06-24 NOTE — Assessment & Plan Note (Signed)
Blood pressure is mildly elevated. Change Cardizem to 180 mg daily.

## 2013-06-24 NOTE — Assessment & Plan Note (Signed)
Significant aortic stenosis murmur on examination and recent echocardiogram suggests mild aortic stenosis based on mean gradient. Plan repeat echocardiogram in January of 2016. We discussed that this is a progressive problem.

## 2013-07-08 ENCOUNTER — Encounter: Payer: Self-pay | Admitting: *Deleted

## 2013-07-08 ENCOUNTER — Encounter (INDEPENDENT_AMBULATORY_CARE_PROVIDER_SITE_OTHER): Payer: Medicare Other

## 2013-07-08 DIAGNOSIS — I4891 Unspecified atrial fibrillation: Secondary | ICD-10-CM

## 2013-07-08 NOTE — Progress Notes (Signed)
Patient ID: Tara Mack, female   DOB: 1931-02-04, 80 y.o.   MRN: [id number] E-Cardio 24 hour holter monitor applied to patient.

## 2013-07-12 ENCOUNTER — Telehealth: Payer: Self-pay | Admitting: *Deleted

## 2013-07-12 NOTE — Telephone Encounter (Signed)
Spoke with pt, Monitor reviewed by dr Stanford Breed shows atrial fib with pvc's or aberrantly conducted beats; rate controlled.

## 2013-07-23 ENCOUNTER — Other Ambulatory Visit: Payer: Self-pay | Admitting: Geriatric Medicine

## 2013-07-23 DIAGNOSIS — M549 Dorsalgia, unspecified: Secondary | ICD-10-CM

## 2013-08-04 ENCOUNTER — Ambulatory Visit
Admission: RE | Admit: 2013-08-04 | Discharge: 2013-08-04 | Disposition: A | Discharge status: Home / Self Care | Payer: Medicare Other | Source: Ambulatory Visit | Attending: Geriatric Medicine | Admitting: Geriatric Medicine

## 2013-08-04 DIAGNOSIS — M549 Dorsalgia, unspecified: Secondary | ICD-10-CM

## 2013-09-24 ENCOUNTER — Encounter: Payer: Self-pay | Admitting: Cardiology

## 2013-09-24 ENCOUNTER — Ambulatory Visit (INDEPENDENT_AMBULATORY_CARE_PROVIDER_SITE_OTHER): Payer: Medicare Other | Admitting: Cardiology

## 2013-09-24 VITALS — BP 130/80 | HR 88 | Ht 63.0 in | Wt 192.8 lb

## 2013-09-24 DIAGNOSIS — I350 Nonrheumatic aortic (valve) stenosis: Secondary | ICD-10-CM

## 2013-09-24 DIAGNOSIS — I359 Nonrheumatic aortic valve disorder, unspecified: Secondary | ICD-10-CM

## 2013-09-24 DIAGNOSIS — I10 Essential (primary) hypertension: Secondary | ICD-10-CM

## 2013-09-24 DIAGNOSIS — E785 Hyperlipidemia, unspecified: Secondary | ICD-10-CM

## 2013-09-24 DIAGNOSIS — I4891 Unspecified atrial fibrillation: Secondary | ICD-10-CM

## 2013-09-24 LAB — CBC WITH DIFFERENTIAL/PLATELET
BASOS ABS: 0 10*3/uL (ref 0.0–0.1)
BASOS PCT: 0.4 % (ref 0.0–3.0)
Eosinophils Absolute: 0.3 10*3/uL (ref 0.0–0.7)
Eosinophils Relative: 4.1 % (ref 0.0–5.0)
HCT: 40.5 % (ref 36.0–46.0)
Hemoglobin: 13.5 g/dL (ref 12.0–15.0)
LYMPHS PCT: 12.2 % (ref 12.0–46.0)
Lymphs Abs: 0.9 10*3/uL (ref 0.7–4.0)
MCHC: 33.2 g/dL (ref 30.0–36.0)
MCV: 100 fl (ref 78.0–100.0)
MONOS PCT: 9 % (ref 3.0–12.0)
Monocytes Absolute: 0.7 10*3/uL (ref 0.1–1.0)
Neutro Abs: 5.7 10*3/uL (ref 1.4–7.7)
Neutrophils Relative %: 74.3 % (ref 43.0–77.0)
Platelets: 212 10*3/uL (ref 150.0–400.0)
RBC: 4.05 Mil/uL (ref 3.87–5.11)
RDW: 13.7 % (ref 11.5–15.5)
WBC: 7.7 10*3/uL (ref 4.0–10.5)

## 2013-09-24 LAB — BASIC METABOLIC PANEL
BUN: 15 mg/dL (ref 6–23)
CALCIUM: 9 mg/dL (ref 8.4–10.5)
CO2: 28 mEq/L (ref 19–32)
Chloride: 96 mEq/L (ref 96–112)
Creatinine, Ser: 0.7 mg/dL (ref 0.4–1.2)
GFR: 82.75 mL/min (ref >60.00)
Glucose, Bld: 94 mg/dL (ref 70–99)
Potassium: 4.2 mEq/L (ref 3.5–5.1)
SODIUM: 132 meq/L — AB (ref 135–145)

## 2013-09-24 NOTE — Assessment & Plan Note (Signed)
Plan followup echocardiogram in January of 2016.

## 2013-09-24 NOTE — Assessment & Plan Note (Signed)
Patient remains asymptomatic. Plan rate control and anticoagulation. Continue Cardizem and apixaban. Check hemoglobin and renal function.

## 2013-09-24 NOTE — Assessment & Plan Note (Signed)
Blood pressure controlled. Continue present medications. 

## 2013-09-24 NOTE — Patient Instructions (Signed)
Your physician wants you to follow-up in: 6 MONTHS WITH DR CRENSHAW You will receive a reminder letter in the mail two months in advance. If you don't receive a letter, please call our office to schedule the follow-up appointment.   Your physician recommends that you HAVE LAB WORK TODAY 

## 2013-09-24 NOTE — Progress Notes (Signed)
HPI: FU atrial fibrillation. Echocardiogram in January of 2015 showed normal LV function, moderate to severe left atrial enlargement, mild right atrial enlargement and mildly reduced RV function. The aortic valves were severely thickened. There was mild aortic stenosis by Doppler with a mean gradient of 18 mm of mercury. Patient apparently had transient atrial fibrillation some years ago in the setting of higher synthroid doses. She was noted to be in atrial fibrillation by her primary care physician previously. We saw her in January of 2015 And recommended rate control and anti-coagulation. We increased her Cardizem and added apixaban. Holter monitor in March of 2015 showed atrial fibrillation with rate controlled. Since last seen, Denies dyspnea, chest pain, palpitations or syncope.   Current Outpatient Prescriptions  Medication Sig Dispense Refill  . CRESTOR 5 MG tablet       . diclofenac sodium (VOLTAREN) 1 % GEL Apply 1-2 g topically 4 (four) times daily. as directed      . diltiazem (CARDIZEM CD) 180 MG 24 hr capsule Take 1 capsule (180 mg total) by mouth daily.  30 capsule  12  . ELIQUIS 5 MG TABS tablet       . estradiol (VIVELLE-DOT) 0.1 MG/24HR Place 1 patch onto the skin twice a week.        . fluticasone (FLONASE) 50 MCG/ACT nasal spray       . furosemide (LASIX) 20 MG tablet Take 20 mg by mouth daily.       Marland Kitchen levothyroxine (SYNTHROID, LEVOTHROID) 50 MCG tablet Take 50 mcg by mouth daily. 75 MCG ON mwf AND 50 MCG ALL OTHERS      . losartan (COZAAR) 100 MG tablet Take 100 mg by mouth daily.      Marland Kitchen omeprazole (PRILOSEC) 20 MG capsule       . tretinoin (RETIN-A) 0.05 % cream Apply a small amount to skin daily      . venlafaxine (EFFEXOR-XR) 75 MG 24 hr capsule Take 75 mg by mouth at bedtime.         No current facility-administered medications for this visit.     Past Medical History  Diagnosis Date  . FIBRILLATION, ATRIAL   . HYPERLIPIDEMIA   . HYPERTENSION, BENIGN  SYSTEMIC   . ANEMIA NOS   . BICEPS TENDON RUPTURE, LEFT   . Closed anterior dislocation of humerus   . DEPRESSION, MAJOR, RECURRENT   . FATIGUE, CHRONIC   . HYPOTHYROIDISM NOS   . OBESITY NOS   . OSTEOARTHRITIS, MULTI SITES   . Rosacea   . SUBACROMIAL BURSITIS, RIGHT   . Breast cancer     Past Surgical History  Procedure Laterality Date  . Mastectomy Right   . Tonsillectomy    . Abdominal hysterectomy      History   Social History  . Marital Status: Married    Spouse Name: N/A    Number of Children: 2  . Years of Education: N/A   Occupational History  .     Social History Main Topics  . Smoking status: Never Smoker   . Smokeless tobacco: Not on file  . Alcohol Use: Yes     Comment: 3 drinks per night  . Drug Use: Not on file  . Sexual Activity: Not on file   Other Topics Concern  . Not on file   Social History Narrative  . No narrative on file    ROS: no fevers or chills, productive cough, hemoptysis, dysphasia, odynophagia, melena, hematochezia,  dysuria, hematuria, rash, seizure activity, orthopnea, PND, pedal edema, claudication. Remaining systems are negative.  Physical Exam: Well-developed obese in no acute distress.  Skin is warm and dry.  HEENT is normal.  Neck is supple.  Chest is clear to auscultation with normal expansion.  Cardiovascular exam is irregular Abdominal exam nontender or distended. No masses palpated. Extremities show no edema. neuro grossly intact  ECG Atrial fibrillation at a rate of 88. Occasional PVC or aberrantly conducted beats. Cannot rule out prior anterior infarct.

## 2013-09-24 NOTE — Assessment & Plan Note (Signed)
Continue statin. 

## 2014-02-18 ENCOUNTER — Other Ambulatory Visit: Payer: Self-pay

## 2014-07-14 ENCOUNTER — Other Ambulatory Visit: Payer: Self-pay | Admitting: Cardiology

## 2014-09-15 ENCOUNTER — Encounter: Payer: Self-pay | Admitting: Cardiology

## 2014-10-11 ENCOUNTER — Ambulatory Visit: Payer: Self-pay | Admitting: Cardiology

## 2014-10-31 ENCOUNTER — Other Ambulatory Visit: Payer: Self-pay

## 2014-11-14 ENCOUNTER — Other Ambulatory Visit: Payer: Self-pay | Admitting: Cardiology

## 2014-11-14 ENCOUNTER — Encounter: Payer: Self-pay | Admitting: Physical Therapy

## 2014-11-14 ENCOUNTER — Other Ambulatory Visit: Payer: Self-pay

## 2014-11-14 ENCOUNTER — Ambulatory Visit: Payer: Medicare Other | Attending: Rheumatology | Admitting: Physical Therapy

## 2014-11-14 DIAGNOSIS — M25562 Pain in left knee: Secondary | ICD-10-CM | POA: Diagnosis present

## 2014-11-14 DIAGNOSIS — R262 Difficulty in walking, not elsewhere classified: Secondary | ICD-10-CM

## 2014-11-14 DIAGNOSIS — M25561 Pain in right knee: Secondary | ICD-10-CM | POA: Diagnosis present

## 2014-11-14 DIAGNOSIS — M5442 Lumbago with sciatica, left side: Secondary | ICD-10-CM | POA: Diagnosis present

## 2014-11-14 DIAGNOSIS — M5441 Lumbago with sciatica, right side: Secondary | ICD-10-CM | POA: Diagnosis present

## 2014-11-14 MED ORDER — DILTIAZEM HCL ER COATED BEADS 180 MG PO CP24: 180.0000 mg | ORAL_CAPSULE | Freq: Every day | ORAL | Status: DC

## 2014-11-14 NOTE — Therapy (Signed)
[place], [place], [address] Phone: [phone]   Fax:  [phone]  Physical Therapy Evaluation  Patient Details  Name: Tara Mack MRN: [id number] Date of Birth: [birth year] Referring Provider:  Bo Merino, MD  Encounter Date: 11/14/2014      PT End of Session - 11/14/14 1520    Visit Number 1   Number of Visits 12   Date for PT Re-Evaluation 01/09/15   PT Start Time 1950   PT Stop Time 1510   PT Time Calculation (min) 55 min   Activity Tolerance Patient tolerated treatment well   Behavior During Therapy [facility] for tasks assessed/performed      Past Medical History  Diagnosis Date  . FIBRILLATION, ATRIAL   . HYPERLIPIDEMIA   . HYPERTENSION, BENIGN SYSTEMIC   . ANEMIA NOS   . BICEPS TENDON RUPTURE, LEFT   . Closed anterior dislocation of humerus   . DEPRESSION, MAJOR, RECURRENT   . FATIGUE, CHRONIC   . HYPOTHYROIDISM NOS   . OBESITY NOS   . OSTEOARTHRITIS, MULTI SITES   . Rosacea   . SUBACROMIAL BURSITIS, RIGHT   . Breast cancer     Past Surgical History  Procedure Laterality Date  . Mastectomy Right   . Tonsillectomy    . Abdominal hysterectomy      There were no vitals filed for this visit.  Visit Diagnosis:  Difficulty walking  Bilateral low back pain with sciatica, sciatica laterality unspecified  Knee pain, bilateral      Subjective Assessment - 11/14/14 1418    Subjective Pt presents with knee pain bilateral, decr balance and low back pain (stenosis per Rx). She has had this pain for a couple years.  She has had spinal injections for with min relief.  She reports stiffness, LE weakness (steps), balance difficulties.    Patient is accompained by: Family member   Pertinent History atrial fib   Limitations Sitting;Lifting;Standing;Walking;House hold activities   How long can you sit comfortably? not a problem   How long can you stand comfortably? 5-10 min, needs to  sit due to balance, fatigue   How long can you walk comfortably? without device, 50 feet (chair to car)   Diagnostic tests XR multiple, MRI 08/2013   Currently in Pain? No/denies  4/10   Pain Location Back   Pain Orientation Right   Pain Descriptors / Indicators Shooting;Sore   Pain Type Chronic pain   Pain Radiating Towards back of both thighs   Pain Onset More than a month ago   Pain Frequency Intermittent   Aggravating Factors  walking, standing   Pain Relieving Factors Tylenol, pain patches (OTC)   Effect of Pain on Daily Activities diff with activities   Multiple Pain Sites Yes   Pain Score 5   Pain Location Knee   Pain Orientation Right;Left   Pain Descriptors / Indicators Sore   Pain Type Chronic pain   Pain Onset More than a month ago   Pain Frequency Intermittent   Aggravating Factors  walking, community mobility   Pain Relieving Factors Tylenol, rest, patches            [facility] PT Assessment - 11/14/14 1422    Assessment   Medical Diagnosis spinal stenosis, OA knees   Onset Date/Surgical Date --  chronic   Next MD Visit unsure   Prior Therapy No   Precautions   Precautions Fall   Restrictions   Weight Bearing Restrictions No  Balance Screen   Has the patient fallen in the past 6 months Yes   How many times? 3-4  careless   Has the patient had a decrease in activity level because of a fear of falling?  Yes   Is the patient reluctant to leave their home because of a fear of falling?  Yes  alone, yes   [place] residence   Living Arrangements Spouse/significant other   Type of [place] to enter   Home Layout Two level   Prior Function   Level of Independence Independent with basic ADLs;Independent with household mobility with device   Vocation Retired   Systems analyst   Overall Cognitive Status Within Functional Limits for tasks assessed   Observation/Other Assessments   Focus  on Therapeutic Outcomes (FOTO)  46%   Observation/Other Assessments-Edema    Edema --  LLE   Sensation   Light Touch Appears Intact   Coordination   Gross Motor Movements are Fluid and Coordinated Not tested   Posture/Postural Control   Posture/Postural Control Postural limitations   Postural Limitations Rounded Shoulders;Forward head;Increased thoracic kyphosis;Posterior pelvic tilt;Right pelvic obliquity;Flexed trunk   Posture Comments high L shoulder and high Rt. hjp   AROM   Right Knee Extension 128   Right Knee Flexion 0   Left Knee Extension 135   Left Knee Flexion 6   Lumbar Flexion 25% pain    Lumbar Extension 50% pain    Lumbar - Right Side Bend 25% pain L    Lumbar - Left Side Bend 25% pain R   Lumbar - Right Rotation 25% pain   Lumbar - Left Rotation 25% no pain    Strength   Right Hip Flexion 4/5   Right Hip ABduction 3+/5   Left Hip Flexion 3+/5   Left Hip ABduction 3+/5   Right Knee Flexion 4+/5   Right Knee Extension 4/5   Left Knee Flexion 4+/5   Left Knee Extension 4+/5   Right Ankle Dorsiflexion 4/5   Left Ankle Dorsiflexion 4/5   Flexibility   Soft Tissue Assessment /Muscle Length yes   Hamstrings 70 approx   Palpation   Palpation comment min tenderness along Rt. lateral lumbar region   Standardized Balance Assessment   Standardized Balance Assessment Timed Up and Go Test  12, 12, 13 sec   Timed Up and Go Test   Normal TUG (seconds) 12            PT Education - 11/14/14 1520    Education provided Yes   Education Details PT/POC, balance   Person(s) Educated Patient   Methods Explanation;Demonstration   Comprehension Verbalized understanding;Returned demonstration             PT Long Term Goals - 11/14/14 1527    PT LONG TERM GOAL #1   Title Pt will be able to demo corrected posture and understand implications for pain and joint inflammation.    Time 6   Period Weeks   Status New   PT LONG TERM GOAL #2   Title Pt will be able to  stand/walk for 20- 30 min for light housework and household mobility.    Time 6   Period Weeks   Status New   PT LONG TERM GOAL #3   Title Pt will be able to improve Berg balance score by 6 points to demo decr fall risk.    Time 6  Period Weeks   Status New   PT LONG TERM GOAL #4   Title Pt will be I with HEP for core, hips, knees.    Time 6   Period Weeks   Status New               Plan - 07-Dec-2014 1521    Clinical Impression Statement Patient seems able to function with min difficulty but describes pain with most activities.  She has significant weakness in core and hips, postural asymmetry and gait instability.  She will benefit from skilled PT to improve mobility, reduce pain, reduce risk of falls.    Pt will benefit from skilled therapeutic intervention in order to improve on the following deficits Abnormal gait;Decreased range of motion;Difficulty walking;Increased fascial restricitons;Impaired UE functional use;Decreased endurance;Decreased activity tolerance;Pain;Decreased balance;Impaired flexibility;Improper body mechanics;Decreased mobility;Decreased strength;Increased edema;Postural dysfunction   Rehab Potential Good   PT Frequency 2x / week   PT Duration 6 weeks   PT Treatment/Interventions ADLs/Self Care Home Management;Functional mobility training;Patient/family education;Moist Heat;Cryotherapy;Electrical Stimulation;Gait training;Stair training;Manual techniques;Neuromuscular re-education;Balance training;Therapeutic exercise;Ultrasound;Passive range of motion;Therapeutic activities;Iontophoresis 4mg /ml Dexamethasone   PT Next Visit Plan establish HEP, Berg   PT Home Exercise Plan none given    Consulted and Agree with Plan of Care Patient          G-Codes - 12/07/2014 1530    Functional Assessment Tool Used FOTO   Functional Limitation Mobility: Walking and moving around   Mobility: Walking and Moving Around Current Status [phone]) At least 40 percent but less  than 60 percent impaired, limited or restricted   Mobility: Walking and Moving Around Goal Status [phone]) At least 20 percent but less than 40 percent impaired, limited or restricted      Problem List Patient Active Problem List   Diagnosis Date Noted  . Aortic stenosis 06/24/2013  . Foot pain, bilateral 11/05/2011  . Morton neuroma 11/05/2011  . Repeated falls 11/05/2011  . CLOSED ANTERIOR DISLOCATION OF HUMERUS 03/26/2010  . SUBACROMIAL BURSITIS, RIGHT 08/14/2009  . KNEE PAIN, RIGHT 06/05/2009  . BICEPS TENDON RUPTURE, LEFT 03/02/2009  . ARM PAIN, LEFT 03/02/2009  . SHOULDER PAIN, LEFT 09/16/2008  . ANEMIA NOS 02/26/2007  . MYALGIA 02/19/2007  . HYPOTHYROIDISM NOS 07/17/2006  . OBESITY NOS 07/17/2006  . FIBRILLATION, ATRIAL 07/17/2006  . FATIGUE, CHRONIC 07/17/2006  . HYPERLIPIDEMIA 07/03/2006  . DEPRESSION, MAJOR, RECURRENT 07/03/2006  . HYPERTENSION, BENIGN SYSTEMIC 07/03/2006  . ROSACEA 07/03/2006  . Candiss Norse SITES 07/03/2006    PAA,JENNIFER 12/07/14, 3:32 PM  [facility] [address] [place], [place], [address] Phone: [phone]   Fax:  [phone]  By signing I understand that I am ordering/authorizing the use of Iontophoresis using 4 mg/mL of dexamethasone as a component of this plan of care.  Raeford Razor, PT 12-07-14 3:33 PM Phone: [phone] Fax: [phone]

## 2014-11-29 ENCOUNTER — Ambulatory Visit: Payer: Medicare Other | Admitting: Physical Therapy

## 2014-11-29 DIAGNOSIS — R262 Difficulty in walking, not elsewhere classified: Secondary | ICD-10-CM

## 2014-11-29 DIAGNOSIS — M25562 Pain in left knee: Secondary | ICD-10-CM

## 2014-11-29 DIAGNOSIS — M25561 Pain in right knee: Secondary | ICD-10-CM

## 2014-11-29 DIAGNOSIS — M5441 Lumbago with sciatica, right side: Secondary | ICD-10-CM

## 2014-11-29 DIAGNOSIS — M5442 Lumbago with sciatica, left side: Principal | ICD-10-CM

## 2014-11-29 NOTE — Therapy (Signed)
[place], [place], [address] Phone: [phone]   Fax:  [phone]  Physical Therapy Treatment  Patient Details  Name: Tara Mack MRN: [id number] Date of Birth: 10-08-1930 Referring Provider:  Lajean Manes, MD  Encounter Date: 11/29/2014      PT End of Session - 11/29/14 1508    Visit Number 2   Number of Visits 12   Date for PT Re-Evaluation 01/09/15   PT Start Time 1332   PT Stop Time 1416   PT Time Calculation (min) 44 min   Activity Tolerance Patient tolerated treatment well   Behavior During Therapy [facility] for tasks assessed/performed      Past Medical History  Diagnosis Date  . FIBRILLATION, ATRIAL   . HYPERLIPIDEMIA   . HYPERTENSION, BENIGN SYSTEMIC   . ANEMIA NOS   . BICEPS TENDON RUPTURE, LEFT   . Closed anterior dislocation of humerus   . DEPRESSION, MAJOR, RECURRENT   . FATIGUE, CHRONIC   . HYPOTHYROIDISM NOS   . OBESITY NOS   . OSTEOARTHRITIS, MULTI SITES   . Rosacea   . SUBACROMIAL BURSITIS, RIGHT   . Breast cancer     Past Surgical History  Procedure Laterality Date  . Mastectomy Right   . Tonsillectomy    . Abdominal hysterectomy      There were no vitals filed for this visit.  Visit Diagnosis:  Bilateral low back pain with sciatica, sciatica laterality unspecified  Knee pain, bilateral  Difficulty walking      Subjective Assessment - 11/29/14 1339    Subjective Friday both knees were injected  pain now 4-5/10  , Back is 3/10 with activity 5/10.  If she walks around [employer] 1.5 hours she will suffer the next 2 days (has not done for 6 months or more)                         [facility] Adult PT Treatment/Exercise - 11/29/14 1340    Transfers   Comments min assist supine to sit   [employer]   Sit to Stand Able to stand  independently using hands   Standing Unsupported Able to stand safely 2 minutes   Sitting with Back Unsupported but Feet  Supported on Floor or Stool Able to sit safely and securely 2 minutes   Stand to Sit Sits safely with minimal use of hands   Transfers Able to transfer safely, minor use of hands   Standing Unsupported with Eyes Closed Able to stand 10 seconds with supervision   Standing Ubsupported with Feet Together Able to place feet together independently and stand 1 minute safely   From Standing, Reach Forward with Outstretched Arm Can reach confidently >25 cm (10")   From Standing Position, Pick up Object from Floor Able to pick up shoe safely and easily   From Standing Position, Turn to Look Behind Over each Shoulder Looks behind one side only/other side shows less weight shift   Turn 360 Degrees Able to turn 360 degrees safely but slowly   Standing Unsupported, Alternately Place Feet on Step/Stool Needs assistance to keep from falling or unable to try   Standing Unsupported, One Foot in Front Needs help to step but can hold 15 seconds   Standing on One Leg Unable to try or needs assist to prevent fall   Total Score 40   Lumbar Exercises: Stretches   Passive Hamstring Stretch 3 reps;30 seconds  Single Knee to Chest Stretch Limitations 10 reps 5 seconds hold   Lower Trunk Rotation 5 reps;10 seconds   Knee/Hip Exercises: Stretches   Gastroc Stretch 3 reps;30 seconds  each                PT Education - 11/29/14 1508    Education provided Yes   Education Details beginning home exercise   Person(s) Educated Patient   Methods Explanation;Demonstration;Handout;Verbal cues;Tactile cues   Comprehension Verbalized understanding;Returned demonstration             PT Long Term Goals - 11/14/14 1527    PT LONG TERM GOAL #1   Title Pt will be able to demo corrected posture and understand implications for pain and joint inflammation.    Time 6   Period Weeks   Status New   PT LONG TERM GOAL #2   Title Pt will be able to stand/walk for 20- 30 min for light housework and household mobility.     Time 6   Period Weeks   Status New   PT LONG TERM GOAL #3   Title Pt will be able to improve Berg balance score by 6 points to demo decr fall risk.    Time 6   Period Weeks   Status New   PT LONG TERM GOAL #4   Title Pt will be I with HEP for core, hips, knees.    Time 6   Period Weeks   Status New               Plan - 11/29/14 1509    Clinical Impression Statement 40/56 BERG, patient issued initial home exercise.  She does not like to exercise.  Possible to return to [employer]neakers post PT. Knee injections decreased knee pain.   PT Next Visit Plan PT to interpert BERG score and discuss with patient.  Review and continue exercise   PT Home Exercise Plan basic back   Consulted and Agree with Plan of Care Patient        Problem List Patient Active Problem List   Diagnosis Date Noted  . Aortic stenosis 06/24/2013  . Foot pain, bilateral 11/05/2011  . Morton neuroma 11/05/2011  . Repeated falls 11/05/2011  . CLOSED ANTERIOR DISLOCATION OF HUMERUS 03/26/2010  . SUBACROMIAL BURSITIS, RIGHT 08/14/2009  . KNEE PAIN, RIGHT 06/05/2009  . BICEPS TENDON RUPTURE, LEFT 03/02/2009  . ARM PAIN, LEFT 03/02/2009  . SHOULDER PAIN, LEFT 09/16/2008  . ANEMIA NOS 02/26/2007  . MYALGIA 02/19/2007  . HYPOTHYROIDISM NOS 07/17/2006  . OBESITY NOS 07/17/2006  . FIBRILLATION, ATRIAL 07/17/2006  . FATIGUE, CHRONIC 07/17/2006  . HYPERLIPIDEMIA 07/03/2006  . DEPRESSION, MAJOR, RECURRENT 07/03/2006  . HYPERTENSION, BENIGN SYSTEMIC 07/03/2006  . ROSACEA 07/03/2006  . Candiss Norse SITES 07/03/2006    HARRIS,KAREN 11/29/2014, 3:12 PM  [facility] [address] [place], [place], [address] Phone: [phone]   Fax:  [phone]     Melvenia Needles, PTA 11/29/2014 3:12 PM Phone: [phone] Fax: [phone]

## 2014-12-05 ENCOUNTER — Ambulatory Visit: Payer: Medicare Other | Attending: Rheumatology | Admitting: Physical Therapy

## 2014-12-05 DIAGNOSIS — R262 Difficulty in walking, not elsewhere classified: Secondary | ICD-10-CM | POA: Insufficient documentation

## 2014-12-05 DIAGNOSIS — M25562 Pain in left knee: Secondary | ICD-10-CM | POA: Diagnosis present

## 2014-12-05 DIAGNOSIS — M5442 Lumbago with sciatica, left side: Secondary | ICD-10-CM | POA: Insufficient documentation

## 2014-12-05 DIAGNOSIS — M5441 Lumbago with sciatica, right side: Secondary | ICD-10-CM | POA: Diagnosis not present

## 2014-12-05 DIAGNOSIS — M25561 Pain in right knee: Secondary | ICD-10-CM | POA: Diagnosis present

## 2014-12-05 NOTE — Therapy (Addendum)
[place], [place], [address] Phone: [phone]   Fax:  [phone]  Physical Therapy Treatment  Patient Details  Name: Tara Mack MRN: [id number] Date of Birth: 05-11-1930 Referring Provider:  Lajean Manes, MD  Encounter Date: 12/05/2014      PT End of Session - 12/05/14 1159    Visit Number 3   Number of Visits 12   Date for PT Re-Evaluation 01/09/15   PT Start Time 1105   PT Stop Time 1145   PT Time Calculation (min) 40 min   Activity Tolerance Patient tolerated treatment well   Behavior During Therapy [facility] for tasks assessed/performed      Past Medical History  Diagnosis Date  . FIBRILLATION, ATRIAL   . HYPERLIPIDEMIA   . HYPERTENSION, BENIGN SYSTEMIC   . ANEMIA NOS   . BICEPS TENDON RUPTURE, LEFT   . Closed anterior dislocation of humerus   . DEPRESSION, MAJOR, RECURRENT   . FATIGUE, CHRONIC   . HYPOTHYROIDISM NOS   . OBESITY NOS   . OSTEOARTHRITIS, MULTI SITES   . Rosacea   . SUBACROMIAL BURSITIS, RIGHT   . Breast cancer     Past Surgical History  Procedure Laterality Date  . Mastectomy Right   . Tonsillectomy    . Abdominal hysterectomy      There were no vitals filed for this visit.  Visit Diagnosis:  Bilateral low back pain with sciatica, sciatica laterality unspecified  Knee pain, bilateral  Difficulty walking      Subjective Assessment - 12/05/14 1106    Subjective Pt states her knees still feel good from the shots she received on friday. Her back is currently not giving her any problems. Pt states compliance with HEP, sometimes she forgets but overall she completes them regularly.   Currently in Pain? No/denies           [facility] Adult PT Treatment/Exercise - 12/05/14 1110    Exercises   Exercises Knee/Hip   Lumbar Exercises: Stretches   Active Hamstring Stretch 3 reps;30 seconds  bilat LE with strap   Single Knee to Chest Stretch 2 reps;20 seconds    Lower Trunk Rotation 20 seconds;Other (comment)  2 sets   Knee/Hip Exercises: Stretches   Gastroc Stretch Both;2 reps;20 seconds   Knee/Hip Exercises: Aerobic   Nustep 5 min, L 1   Knee/Hip Exercises: Standing   Hip Flexion AROM;Stengthening;Both;2 sets;10 reps;Knee straight  performed standing on low step to assist w/ foot clearance   Hip Abduction AROM;Stengthening;Both;2 sets;10 reps;Knee straight  performed standing on low step to assist w/ foot clearance   Hip Extension AROM;Stengthening;Both;2 sets;10 reps;Knee straight  performed standing on low step to assist w/ foot clearance             PT Education - 12/05/14 1158    Education provided Yes   Education Details HEP, difference when using ice/heat, BERG interpretation and use of cane to decrease fall risk   Person(s) Educated Patient   Methods Explanation;Demonstration;Handout   Comprehension Verbalized understanding;Returned demonstration          PT Long Term Goals - 12/05/14 1210    PT LONG TERM GOAL #1   Title Pt will be able to demo corrected posture and understand implications for pain and joint inflammation.    Time 6   Period Weeks   Status On-going   PT LONG TERM GOAL #2   Title Pt will be able to stand/walk for 20-  30 min for light housework and household mobility.    Time 6   Period Weeks   Status On-going   PT LONG TERM GOAL #3   Title Pt will be able to improve Berg balance score by 6 points to demo decr fall risk.    Time 6   Period Weeks   Status On-going   PT LONG TERM GOAL #4   Title Pt will be I with HEP for core, hips, knees.    Time 6   Period Weeks   Status On-going               Plan - 12/05/14 1200    Clinical Impression Statement Pt. entered clinic without pain in back and knees. Pt educated on the use of a cane for safety while walking, however the patient expressed that she really prefers not to use the cane. She was instructed to bring it in next time with her to make  sure it is set appropriately for her height.  Pt HEP was revised to introduce strengthening exercises. 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 This area was red, warm , and inflamed from her elbow down to mid forearm and she was instructed to see her doctor about this,, but she said she would "if it didnt get better."   PT Next Visit Plan continue with standing strengthening exercises, nu step, gait with cane   PT Home Exercise Plan standing strengthening exercises   Consulted and Agree with Plan of Care Patient        Problem List Patient Active Problem List   Diagnosis Date Noted  . Aortic stenosis 06/24/2013  . Foot pain, bilateral 11/05/2011  . Morton neuroma 11/05/2011  . Repeated falls 11/05/2011  . CLOSED ANTERIOR DISLOCATION OF HUMERUS 03/26/2010  . SUBACROMIAL BURSITIS, RIGHT 08/14/2009  . KNEE PAIN, RIGHT 06/05/2009  . BICEPS TENDON RUPTURE, LEFT 03/02/2009  . ARM PAIN, LEFT 03/02/2009  . SHOULDER PAIN, LEFT 09/16/2008  . ANEMIA NOS 02/26/2007  . MYALGIA 02/19/2007  . HYPOTHYROIDISM NOS 07/17/2006  . OBESITY NOS 07/17/2006  . FIBRILLATION, ATRIAL 07/17/2006  . FATIGUE, CHRONIC 07/17/2006  . HYPERLIPIDEMIA 07/03/2006  . DEPRESSION, MAJOR, RECURRENT 07/03/2006  . HYPERTENSION, BENIGN SYSTEMIC 07/03/2006  . ROSACEA 07/03/2006  . OSTEOARTHRITIS, MULTI SITES 07/03/2006    PAA,JENNIFER 12/05/2014, 1:05 PM  [place], [place], [address] Phone: [phone]   Fax:  [phone]    Raeford Razor, PT 12/05/2014 1:05 PM Phone: [phone] Fax: [phone]

## 2014-12-05 NOTE — Patient Instructions (Signed)
EXTENSION: Standing (Active)   Stand, both feet flat. Draw right leg behind body as far as possible. Complete _3__ sets of _10__ repetitions. Perform _2__ sessions per day.  http://gtsc.exer.us/77   Copyright  VHI. All rights reserved.  (Clinic) Extension: Hip   Abduction ABDUCTION: Standing (Active)   Stand, feet flat. Lift right leg out to side. Complete _3__ sets of __10_ repetitions. Perform _2__ sessions per day.  http://gtsc.exer.us/111   Copyright  VHI. All rights reserved.    HOLD ON TO COUNTER TOP

## 2014-12-07 ENCOUNTER — Ambulatory Visit: Payer: Medicare Other | Admitting: Physical Therapy

## 2014-12-07 DIAGNOSIS — M5441 Lumbago with sciatica, right side: Secondary | ICD-10-CM | POA: Diagnosis not present

## 2014-12-07 DIAGNOSIS — R262 Difficulty in walking, not elsewhere classified: Secondary | ICD-10-CM

## 2014-12-07 DIAGNOSIS — M25562 Pain in left knee: Principal | ICD-10-CM

## 2014-12-07 DIAGNOSIS — M25561 Pain in right knee: Secondary | ICD-10-CM

## 2014-12-07 NOTE — Progress Notes (Signed)
HPI: FU atrial fibrillation. Echocardiogram in January of 2015 showed normal LV function, moderate to severe left atrial enlargement, mild right atrial enlargement and mildly reduced RV function. The aortic valves were severely thickened. There was mild aortic stenosis by Doppler with a mean gradient of 18 mm of mercury. Also with atrial fibrillation treated with rate control and anticoagulation. Holter monitor in March of 2015 showed atrial fibrillation with rate controlled. Since last seen, she denies dyspnea, chest pain, palpitations or syncope. No bleeding.  Current Outpatient Prescriptions  Medication Sig Dispense Refill  . cephALEXin (KEFLEX) 500 MG capsule Take 1 capsule by mouth 3 (three) times daily.    . CRESTOR 5 MG tablet     . diltiazem (CARDIZEM CD) 180 MG 24 hr capsule Take 1 capsule (180 mg total) by mouth daily. 30 capsule 1  . ELIQUIS 5 MG TABS tablet     . fluticasone (FLONASE) 50 MCG/ACT nasal spray     . furosemide (LASIX) 20 MG tablet Take 20 mg by mouth daily.     Marland Kitchen levothyroxine (SYNTHROID, LEVOTHROID) 50 MCG tablet Take 50 mcg by mouth daily. 75 MCG ON mwf AND 50 MCG ALL OTHERS    . losartan (COZAAR) 100 MG tablet Take 100 mg by mouth daily.    Marland Kitchen SYNTHROID 75 MCG tablet Take 1 tablet by mouth. 75mg  Monday Wednesday Friday 50mg  Tuesday Thursday Saturday Sunday    . tretinoin (RETIN-A) 0.05 % cream Apply a small amount to skin daily    . triamcinolone cream (KENALOG) 0.1 % Apply 1 application topically. Few times a week    . venlafaxine (EFFEXOR-XR) 75 MG 24 hr capsule Take 75 mg by mouth at bedtime.       No current facility-administered medications for this visit.     Past Medical History  Diagnosis Date  . FIBRILLATION, ATRIAL   . HYPERLIPIDEMIA   . HYPERTENSION, BENIGN SYSTEMIC   . ANEMIA NOS   . BICEPS TENDON RUPTURE, LEFT   . Closed anterior dislocation of humerus   . DEPRESSION, MAJOR, RECURRENT   . FATIGUE, CHRONIC   . HYPOTHYROIDISM NOS   .  OBESITY NOS   . OSTEOARTHRITIS, MULTI SITES   . Rosacea   . SUBACROMIAL BURSITIS, RIGHT   . Breast cancer     Past Surgical History  Procedure Laterality Date  . Mastectomy Right   . Tonsillectomy    . Abdominal hysterectomy      History   Social History  . Marital Status: Married    Spouse Name: N/A  . Number of Children: 2  . Years of Education: N/A   Occupational History  .     Social History Main Topics  . Smoking status: Never Smoker   . Smokeless tobacco: Not on file  . Alcohol Use: Yes     Comment: 3 drinks per night  . Drug Use: Not on file  . Sexual Activity: Not on file   Other Topics Concern  . Not on file   Social History Narrative    ROS: no fevers or chills, productive cough, hemoptysis, dysphasia, odynophagia, melena, hematochezia, dysuria, hematuria, rash, seizure activity, orthopnea, PND, pedal edema, claudication. Remaining systems are negative.  Physical Exam: Well-developed obese in no acute distress.  Skin is warm and dry.  HEENT is normal.  Neck is supple.  Chest is clear to auscultation with normal expansion.  Cardiovascular exam is irregular, 3/6 systolic murmur left sternal border. Abdominal exam nontender or  distended. No masses palpated. Extremities show no edema. neuro grossly intact  ECG atrial fibrillation at a rate of 108. PVCs or aberrantly conducted beats. Low voltage.

## 2014-12-07 NOTE — Patient Instructions (Signed)
Bring lace up shoes and inserts and cane next visit.

## 2014-12-07 NOTE — Therapy (Addendum)
[place], [place], [address] Phone: [phone]   Fax:  [phone]  Physical Therapy Treatment  Patient Details  Name: Tara Mack MRN: [id number] Date of Birth: 01-Mar-1931 Referring Provider:  Lajean Manes, MD  Encounter Date: 12/07/2014      PT End of Session - 12/07/14 1747    Visit Number 4   Number of Visits 12   Date for PT Re-Evaluation 01/09/15   PT Start Time 8546   PT Stop Time 1633   PT Time Calculation (min) 46 min   Activity Tolerance Patient tolerated treatment well   Behavior During Therapy [facility] for tasks assessed/performed      Past Medical History  Diagnosis Date  . FIBRILLATION, ATRIAL   . HYPERLIPIDEMIA   . HYPERTENSION, BENIGN SYSTEMIC   . ANEMIA NOS   . BICEPS TENDON RUPTURE, LEFT   . Closed anterior dislocation of humerus   . DEPRESSION, MAJOR, RECURRENT   . FATIGUE, CHRONIC   . HYPOTHYROIDISM NOS   . OBESITY NOS   . OSTEOARTHRITIS, MULTI SITES   . Rosacea   . SUBACROMIAL BURSITIS, RIGHT   . Breast cancer     Past Surgical History  Procedure Laterality Date  . Mastectomy Right   . Tonsillectomy    . Abdominal hysterectomy      There were no vitals filed for this visit.  Visit Diagnosis:  Knee pain, bilateral  Difficulty walking      Subjective Assessment - 12/07/14 1557    Subjective legs are sore.  Anderson Malta was right about my elbow, I have cellulitis.  On Keflex now.   Currently in Pain? Yes   Pain Score 3    Pain Location Leg   Pain Orientation Right;Left   Pain Descriptors / Indicators --  sore   Aggravating Factors  activity   Pain Relieving Factors cortizone shots   Multiple Pain Sites --  elbow pain Lt  moderate, worse cellulitis, nothing better for now.                         [facility] Adult PT Treatment/Exercise - 12/07/14 1609    Ambulation/Gait   Assistive device --  single point cane   Stairs Yes   Number of Stairs  8   Height of Stairs --  4", 6"   Gait Comments 1 wobble, instruction, she has feet inserts not wearing today.  mirroe used for instruction   Knee/Hip Exercises: Civil engineer, contracting Nu step 4.5 minutes , legs only, stopped due to increased knee pain   Knee/Hip Exercises: Standing   Heel Raises 10 reps   Other Standing Knee Exercises Single leg stant slide pillowcase 3 ways 10 reps, gait belt contact guard.     Other Standing Knee Exercises mini squat 10 reps, small motions   Knee/Hip Exercises: Supine   Bridges Limitations 10 reps   Knee/Hip Exercises: Sidelying   Hip ABduction --  5 reps, 2 sets , cues   Clams 5 reps 2 sets                     PT Long Term Goals - 12/07/14 1753    PT LONG TERM GOAL #1   Title Pt will be able to demo corrected posture and understand implications for pain and joint inflammation.    Time 6   Period Weeks   Status On-going   PT  LONG TERM GOAL #2   Title Pt will be able to stand/walk for 20- 30 min for light housework and household mobility.    Time 6   Period Weeks   Status On-going   PT LONG TERM GOAL #3   Title Pt will be able to improve Berg balance score by 6 points to demo decr fall risk.    Time 6   Period Weeks   Status Unable to assess   PT LONG TERM GOAL #4   Title Pt will be I with HEP for core, hips, knees.    Time 6   Period Weeks   Status On-going               Plan - 12/07/14 1747    Clinical Impression Statement Less soreness post exercise.  Patient wants to be done with PT in the middle of August.She does not have the time and she does not want to spentr the money.   These are all things she has done before, she wants to focus on heme ex.. She seems reluctant to change shoe wear which are slip ons that offer no support.  Hip strength Abduction 4-/5 both.   PT Next Visit Plan check out shoes, inserts.  work on exercises for home.   Consulted and Agree with Plan of Care Patient         Problem List Patient Active Problem List   Diagnosis Date Noted  . Aortic stenosis 06/24/2013  . Foot pain, bilateral 11/05/2011  . Morton neuroma 11/05/2011  . Repeated falls 11/05/2011  . CLOSED ANTERIOR DISLOCATION OF HUMERUS 03/26/2010  . SUBACROMIAL BURSITIS, RIGHT 08/14/2009  . KNEE PAIN, RIGHT 06/05/2009  . BICEPS TENDON RUPTURE, LEFT 03/02/2009  . ARM PAIN, LEFT 03/02/2009  . SHOULDER PAIN, LEFT 09/16/2008  . ANEMIA NOS 02/26/2007  . MYALGIA 02/19/2007  . HYPOTHYROIDISM NOS 07/17/2006  . OBESITY NOS 07/17/2006  . FIBRILLATION, ATRIAL 07/17/2006  . FATIGUE, CHRONIC 07/17/2006  . HYPERLIPIDEMIA 07/03/2006  . DEPRESSION, MAJOR, RECURRENT 07/03/2006  . HYPERTENSION, BENIGN SYSTEMIC 07/03/2006  . ROSACEA 07/03/2006  . Candiss Norse SITES 07/03/2006    [facility] 12/07/2014, 5:55 PM  [facility] [address] [place], [place], [address] Phone: [phone]   Fax:  [phone]     Melvenia Needles, PTA 12/07/2014 5:55 PM Phone: [phone] Fax: [phone]   PHYSICAL THERAPY DISCHARGE SUMMARY  Visits from Start of Care: 4  Current functional level related to goals / functional outcomes: See above for most recent info  Remaining deficits: Unknown has not returned   Education / Equipment: HEP, gait, balance and posture Plan: Patient agrees to discharge.  Patient goals were partially met. Patient is being discharged due to not returning since the last visit.  ?????    Raeford Razor, PT 03/24/2015 8:29 AM Phone: [phone] Fax: [phone]

## 2014-12-09 ENCOUNTER — Encounter: Payer: Self-pay | Admitting: *Deleted

## 2014-12-09 ENCOUNTER — Ambulatory Visit (INDEPENDENT_AMBULATORY_CARE_PROVIDER_SITE_OTHER): Payer: Medicare Other | Admitting: Cardiology

## 2014-12-09 ENCOUNTER — Encounter: Payer: Self-pay | Admitting: Cardiology

## 2014-12-09 VITALS — BP 120/60 | HR 108 | Ht 62.0 in | Wt 174.9 lb

## 2014-12-09 DIAGNOSIS — I4891 Unspecified atrial fibrillation: Secondary | ICD-10-CM

## 2014-12-09 DIAGNOSIS — I359 Nonrheumatic aortic valve disorder, unspecified: Secondary | ICD-10-CM

## 2014-12-09 MED ORDER — DILTIAZEM HCL ER COATED BEADS 240 MG PO CP24: 240.0000 mg | ORAL_CAPSULE | Freq: Every day | ORAL | Status: DC

## 2014-12-09 MED ORDER — LOSARTAN POTASSIUM 50 MG PO TABS: 50.0000 mg | ORAL_TABLET | Freq: Every day | ORAL | Status: DC

## 2014-12-09 NOTE — Assessment & Plan Note (Signed)
Plan repeat echocardiogram. 

## 2014-12-09 NOTE — Assessment & Plan Note (Signed)
Continue statin. 

## 2014-12-09 NOTE — Assessment & Plan Note (Signed)
Blood pressure is well controlled. However I am increasing her Cardizem for better rate control of her atrial fibrillation. We will therefore decrease her Cozaar to 50 mg daily. Follow blood pressure and adjust regimen as needed.

## 2014-12-09 NOTE — Assessment & Plan Note (Signed)
Plan rate control and anticoagulation. Heart rate is mildly elevated today. Change Cardizem to 240 mg daily. Continue apixaban. We will obtain her most recent hemoglobin and renal function from primary care.

## 2014-12-09 NOTE — Patient Instructions (Signed)
Your physician wants you to follow-up in: [place] will receive a reminder letter in the mail two months in advance. If you don't receive a letter, please call our office to schedule the follow-up appointment.   Your physician has requested that you have an echocardiogram. Echocardiography is a painless test that uses sound waves to create images of your heart. It provides your doctor with information about the size and shape of your heart and how well your heart's chambers and valves are working. This procedure takes approximately one hour. There are no restrictions for this procedure.  INCREASE DILTIAZEM TO 240 MG ONCE DAILY  DECREASE LOSARTAN TO 50 MG ONCE DAILY= 1/2 OF THE 100 MG TABLET ONCE DAILY

## 2014-12-12 ENCOUNTER — Encounter: Payer: Self-pay | Admitting: Physical Therapy

## 2014-12-14 ENCOUNTER — Encounter: Payer: Self-pay | Admitting: Cardiology

## 2014-12-15 ENCOUNTER — Ambulatory Visit: Payer: Medicare Other | Admitting: Physical Therapy

## 2014-12-19 ENCOUNTER — Encounter: Payer: Self-pay | Admitting: Physical Therapy

## 2014-12-20 ENCOUNTER — Other Ambulatory Visit: Payer: Self-pay

## 2014-12-20 ENCOUNTER — Ambulatory Visit (HOSPITAL_COMMUNITY): Payer: Medicare Other | Attending: Cardiovascular Disease

## 2014-12-20 DIAGNOSIS — I350 Nonrheumatic aortic (valve) stenosis: Secondary | ICD-10-CM | POA: Insufficient documentation

## 2014-12-20 DIAGNOSIS — I517 Cardiomegaly: Secondary | ICD-10-CM | POA: Diagnosis not present

## 2014-12-20 DIAGNOSIS — I10 Essential (primary) hypertension: Secondary | ICD-10-CM | POA: Diagnosis not present

## 2014-12-20 DIAGNOSIS — I359 Nonrheumatic aortic valve disorder, unspecified: Secondary | ICD-10-CM | POA: Diagnosis not present

## 2014-12-20 DIAGNOSIS — Z6831 Body mass index (BMI) 31.0-31.9, adult: Secondary | ICD-10-CM | POA: Insufficient documentation

## 2014-12-20 DIAGNOSIS — E785 Hyperlipidemia, unspecified: Secondary | ICD-10-CM | POA: Diagnosis not present

## 2014-12-20 DIAGNOSIS — I313 Pericardial effusion (noninflammatory): Secondary | ICD-10-CM | POA: Insufficient documentation

## 2014-12-29 NOTE — Progress Notes (Signed)
HPI: FU atrial fibrillation. Echocardiogram in January of 2015 showed normal LV function, moderate to severe left atrial enlargement, mild right atrial enlargement and mildly reduced RV function. The aortic valve was severely thickened. There was mild aortic stenosis by Doppler with a mean gradient of 18 mm of mercury. Also with atrial fibrillation treated with rate control and anticoagulation. Holter monitor in March of 2015 showed atrial fibrillation with rate controlled. Echo 8/16 showed normal LV function, elevated LV filling pressure, severe AS with mean gradient of 43 mmHg, severe LAE, mild RAE. Since last seen, she denies dyspnea, chest pain, palpitations or syncope. No bleeding.  Current Outpatient Prescriptions  Medication Sig Dispense Refill  . CRESTOR 5 MG tablet     . diltiazem (CARDIZEM CD) 240 MG 24 hr capsule Take 1 capsule (240 mg total) by mouth daily. 90 capsule 3  . ELIQUIS 5 MG TABS tablet     . fluticasone (FLONASE) 50 MCG/ACT nasal spray     . furosemide (LASIX) 20 MG tablet Take 20 mg by mouth daily.     Marland Kitchen levothyroxine (SYNTHROID, LEVOTHROID) 50 MCG tablet Take 50 mcg by mouth daily. 75 MCG ON mwf AND 50 MCG ALL OTHERS    . losartan (COZAAR) 50 MG tablet Take 1 tablet (50 mg total) by mouth daily. 90 tablet 3  . SYNTHROID 75 MCG tablet Take 1 tablet by mouth. 75mg  Monday Wednesday Friday 50mg  Tuesday Thursday Saturday Sunday    . tretinoin (RETIN-A) 0.05 % cream Apply a small amount to skin daily    . triamcinolone cream (KENALOG) 0.1 % Apply 1 application topically. Few times a week    . venlafaxine (EFFEXOR-XR) 75 MG 24 hr capsule Take 75 mg by mouth at bedtime.       No current facility-administered medications for this visit.     Past Medical History  Diagnosis Date  . FIBRILLATION, ATRIAL   . HYPERLIPIDEMIA   . HYPERTENSION, BENIGN SYSTEMIC   . ANEMIA NOS   . BICEPS TENDON RUPTURE, LEFT   . Closed anterior dislocation of humerus   . DEPRESSION,  MAJOR, RECURRENT   . FATIGUE, CHRONIC   . HYPOTHYROIDISM NOS   . OBESITY NOS   . OSTEOARTHRITIS, MULTI SITES   . Rosacea   . SUBACROMIAL BURSITIS, RIGHT   . Breast cancer     Past Surgical History  Procedure Laterality Date  . Mastectomy Right   . Tonsillectomy    . Abdominal hysterectomy      Social History   Social History  . Marital Status: Married    Spouse Name: N/A  . Number of Children: 2  . Years of Education: N/A   Occupational History  .     Social History Main Topics  . Smoking status: Never Smoker   . Smokeless tobacco: Not on file  . Alcohol Use: Yes     Comment: 3 drinks per night  . Drug Use: Not on file  . Sexual Activity: Not on file   Other Topics Concern  . Not on file   Social History Narrative    ROS: arthralgias but no fevers or chills, productive cough, hemoptysis, dysphasia, odynophagia, melena, hematochezia, dysuria, hematuria, rash, seizure activity, orthopnea, PND, pedal edema, claudication. Remaining systems are negative.  Physical Exam: Well-developed well-nourished in no acute distress.  Skin is warm and dry.  HEENT is normal.  Neck is supple.  Chest is clear to auscultation with normal expansion.  Cardiovascular exam is  irregular, 3/6 systolic murmur left sternal border. S2 is diminished. Abdominal exam nontender or distended. No masses palpated. Extremities show no edema. neuro grossly intact

## 2015-01-03 ENCOUNTER — Ambulatory Visit (INDEPENDENT_AMBULATORY_CARE_PROVIDER_SITE_OTHER): Payer: Medicare Other | Admitting: Cardiology

## 2015-01-03 ENCOUNTER — Encounter: Payer: Self-pay | Admitting: Cardiology

## 2015-01-03 VITALS — BP 130/60 | HR 100 | Ht 62.0 in | Wt 175.1 lb

## 2015-01-03 DIAGNOSIS — I482 Chronic atrial fibrillation, unspecified: Secondary | ICD-10-CM

## 2015-01-03 DIAGNOSIS — I10 Essential (primary) hypertension: Secondary | ICD-10-CM | POA: Diagnosis not present

## 2015-01-03 DIAGNOSIS — I350 Nonrheumatic aortic (valve) stenosis: Secondary | ICD-10-CM | POA: Diagnosis not present

## 2015-01-03 NOTE — Assessment & Plan Note (Signed)
Continue statin. 

## 2015-01-03 NOTE — Assessment & Plan Note (Signed)
Patient remains in permanent atrial fibrillation on examination. Continue Cardizem for rate control. Continue apixaban. I will obtain her most recent CBC and renal function from primary care.

## 2015-01-03 NOTE — Assessment & Plan Note (Signed)
BP controlled; continue present meds. 

## 2015-01-03 NOTE — Assessment & Plan Note (Signed)
Patient's echocardiogram shows her aortic stenosis has progressed to severe. However she is not having symptoms including no dyspnea, chest pain or syncope. I had a long discussion with she and her husband today. I explained that she will likely require aortic valve replacement (either open or TAVR) in the near future. Given that she is asymptomatic however we will not proceed at this point. Plan repeat echocardiogram in 6 months. I have explained that she should watch for dyspnea, chest pain or syncope and to contact me sooner if the symptoms occur. She may want to pursue aortic valve replacement in [place] where her son works as an Horticulturist, commercial. If so we will be happy to forward records as needed.

## 2015-01-03 NOTE — Patient Instructions (Signed)
Your physician wants you to follow-up in: [place] will receive a reminder letter in the mail two months in advance. If you don't receive a letter, please call our office to schedule the follow-up appointment.   Your physician has requested that you have an echocardiogram. Echocardiography is a painless test that uses sound waves to create images of your heart. It provides your doctor with information about the size and shape of your heart and how well your heart's chambers and valves are working. This procedure takes approximately one hour. There are no restrictions for this procedure.IN 6 MONTHS PRIOR TO APPOINTMENT

## 2015-01-12 ENCOUNTER — Encounter: Payer: Self-pay | Admitting: Cardiology

## 2015-03-13 ENCOUNTER — Telehealth: Payer: Self-pay | Admitting: Cardiology

## 2015-03-13 NOTE — Telephone Encounter (Signed)
Please call,she wants to talk to you about her Losartan and Diltiazem.

## 2015-03-13 NOTE — Telephone Encounter (Signed)
Pt seen by Dr. Stanford Breed on 8/05. Had dose adjustments to meds:  - diltiazem CR dose increase from 180mg  to 240mg   Daily -losartan decrease from 100mg  to 50mg  daily  Pt notes since then, noticeably tired all the time. States she's awake from 8:30am to 9pm usually, and typically has to nap in between. She is requesting medication adjustments or alternatives, feels the med change has affected energy level. Also cites concern that d/t trouble w/ managing weight; she doesn't want anything that would affect progress w/ weight loss.  Informed pt I would defer, see if Erasmo Downer can look at meds and give suggestions - if warranted, med mgmt appt may be considered. Pt voiced thanks.

## 2015-03-13 NOTE — Telephone Encounter (Signed)
Ok to decrease cardizem to 180 mg daily and follow BP. Kirk Ruths

## 2015-03-13 NOTE — Telephone Encounter (Signed)
Can decrease diltiazem back to 180 mg, but will lose some rate control. Could add low dose beta blocker (metoprolol succ 12.5-25), but she might still feel tired.  Routing to Dr. Stanford Breed for recommendation

## 2015-03-14 MED ORDER — DILTIAZEM HCL ER COATED BEADS 180 MG PO CP24: 180.0000 mg | ORAL_CAPSULE | Freq: Every day | ORAL | Status: DC

## 2015-03-14 NOTE — Telephone Encounter (Signed)
Spoke with pt, Aware of dr crenshaw's recommendations. New script sent to the pharmacy  

## 2015-04-13 ENCOUNTER — Ambulatory Visit (INDEPENDENT_AMBULATORY_CARE_PROVIDER_SITE_OTHER): Payer: Medicare Other | Admitting: Cardiology

## 2015-04-13 ENCOUNTER — Encounter: Payer: Self-pay | Admitting: Cardiology

## 2015-04-13 VITALS — BP 132/84 | HR 104 | Ht 62.5 in | Wt 180.2 lb

## 2015-04-13 DIAGNOSIS — I350 Nonrheumatic aortic (valve) stenosis: Secondary | ICD-10-CM | POA: Diagnosis not present

## 2015-04-13 DIAGNOSIS — I10 Essential (primary) hypertension: Secondary | ICD-10-CM | POA: Diagnosis not present

## 2015-04-13 DIAGNOSIS — I482 Chronic atrial fibrillation, unspecified: Secondary | ICD-10-CM

## 2015-04-13 NOTE — Patient Instructions (Signed)
Your physician has requested that you have an echocardiogram. Echocardiography is a painless test that uses sound waves to create images of your heart. It provides your doctor with information about the size and shape of your heart and how well your heart's chambers and valves are working. This procedure takes approximately one hour. There are no restrictions for this procedure.  Kerin Ransom, PA-C, recommends that you schedule a follow-up appointment with Dr Stanford Breed on December 22nd, 2016 at 9:30a.

## 2015-04-13 NOTE — Assessment & Plan Note (Signed)
On Eliquis, rate control

## 2015-04-13 NOTE — Assessment & Plan Note (Signed)
43 mmHg mean gradient Aug 2016

## 2015-04-13 NOTE — Progress Notes (Signed)
04/13/2015 Tara Mack   1930/11/29  [id number]  Primary Physician Mathews Argyle, MD Primary Cardiologist: Dr Stanford Breed  HPI:  Pleasant 81 y/o female followed by Dr Stanford Breed. Her husband is a retired Lexicographer and her son is an Horticulturist, commercial in [place]. The pt has CAF and severe AS. She was recently in [place] over the holidays and her son told her she looked worse. The pt admits with the traveling and cooking she was "exhausted". She feels better now that she is back home but she and her husband feel she has no stamina and early DOE. She denies any syncope or chest pain. We tried increasing her Diltiazem for better rate control but she felt worse.    Current Outpatient Prescriptions  Medication Sig Dispense Refill  . diltiazem (CARDIZEM CD) 180 MG 24 hr capsule Take 1 capsule (180 mg total) by mouth daily. 30 capsule 12  . ELIQUIS 5 MG TABS tablet     . fluticasone (FLONASE) 50 MCG/ACT nasal spray     . furosemide (LASIX) 20 MG tablet Take 20 mg by mouth daily.     Marland Kitchen levothyroxine (SYNTHROID, LEVOTHROID) 50 MCG tablet Take 50 mcg by mouth daily. 75 MCG ON mwf AND 50 MCG ALL OTHERS    . losartan (COZAAR) 50 MG tablet Take 1 tablet (50 mg total) by mouth daily. 90 tablet 3  . SYNTHROID 75 MCG tablet Take 1 tablet by mouth. 75mg  Monday Wednesday Friday 50mg  Tuesday Thursday Saturday Sunday    . tretinoin (RETIN-A) 0.05 % cream Apply a small amount to skin daily    . triamcinolone cream (KENALOG) 0.1 % Apply 1 application topically. Few times a week    . venlafaxine XR (EFFEXOR-XR) 37.5 MG 24 hr capsule Take 1 capsule by mouth daily. Take 1 tab daily     No current facility-administered medications for this visit.    No Known Allergies  Social History   Social History  . Marital Status: Married    Spouse Name: N/A  . Number of Children: 2  . Years of Education: N/A   Occupational History  .     Social History Main Topics  . Smoking status: Never Smoker   .  Smokeless tobacco: Not on file  . Alcohol Use: Yes     Comment: 3 drinks per night  . Drug Use: Not on file  . Sexual Activity: Not on file   Other Topics Concern  . Not on file   Social History Narrative     Review of Systems: General: negative for chills, fever, night sweats or weight changes.  Cardiovascular: negative for chest pain, dyspnea on exertion, edema, orthopnea, palpitations, paroxysmal nocturnal dyspnea or shortness of breath Dermatological: negative for rash Respiratory: negative for cough or wheezing Urologic: negative for hematuria Abdominal: negative for nausea, vomiting, diarrhea, bright red blood per rectum, melena, or hematemesis Neurologic: negative for visual changes, syncope, or dizziness All other systems reviewed and are otherwise negative except as noted above.    Blood pressure 132/84, pulse 104, height 5' 2.5" (1.588 m), weight 180 lb 4 oz (81.761 kg).  General appearance: alert, cooperative, no distress and moderately obese Neck: no JVD and transmitted carotid murmur Lungs: clear to auscultation bilaterally Heart: irregularly irregular rhythm and 2/6 systolic murmur, diminnished S2 Extremities: no edema Skin: pale, cool, dry Neurologic: Grossly normal  EKG AF with VR 104  ASSESSMENT AND PLAN:   Chronic atrial fibrillation (HCC) On Eliquis, rate control  Essential  hypertension, benign .  Severe aortic stenosis 43 mmHg mean gradient Aug 2016   PLAN  Will ask dr Stanford Breed to review  Erlene Quan PA-C 04/13/2015 3:19 PM   As above, patient seen and examined. Patient describes increased fatigue, dyspnea on exertion and lower extremity edema recently. Notes she visited her son in [place] and exerted herself more than usual. She has not had chest pain or syncope. Physical exam shows irregular heart rhythm and 3/6 systolic murmur left sternal border. S2 is mildly diminished. 1+ ankle edema. Patient presents with worseningCHF symptoms. I am  concerned that we have now reached the point that we may need to proceed with aortic valve replacement for her aortic stenosis. We will plan to Repeat her echocardiogram and I will see her back in approximately 2 weeks. If her aortic stenosis has progressed we will arrange right and left heart catheterization and surgical eval for aortic valve replacement. Continue Cardizem and apixaban for atrial fibrillation. Kirk Ruths

## 2015-04-14 ENCOUNTER — Ambulatory Visit (HOSPITAL_COMMUNITY): Payer: Medicare Other | Attending: Internal Medicine

## 2015-04-14 ENCOUNTER — Other Ambulatory Visit: Payer: Self-pay

## 2015-04-14 DIAGNOSIS — Z6832 Body mass index (BMI) 32.0-32.9, adult: Secondary | ICD-10-CM | POA: Diagnosis not present

## 2015-04-14 DIAGNOSIS — I10 Essential (primary) hypertension: Secondary | ICD-10-CM | POA: Insufficient documentation

## 2015-04-14 DIAGNOSIS — I340 Nonrheumatic mitral (valve) insufficiency: Secondary | ICD-10-CM | POA: Insufficient documentation

## 2015-04-14 DIAGNOSIS — E669 Obesity, unspecified: Secondary | ICD-10-CM | POA: Diagnosis not present

## 2015-04-14 DIAGNOSIS — I352 Nonrheumatic aortic (valve) stenosis with insufficiency: Secondary | ICD-10-CM | POA: Diagnosis not present

## 2015-04-14 DIAGNOSIS — I059 Rheumatic mitral valve disease, unspecified: Secondary | ICD-10-CM | POA: Diagnosis not present

## 2015-04-14 DIAGNOSIS — I071 Rheumatic tricuspid insufficiency: Secondary | ICD-10-CM | POA: Diagnosis not present

## 2015-04-14 DIAGNOSIS — I350 Nonrheumatic aortic (valve) stenosis: Secondary | ICD-10-CM

## 2015-04-14 DIAGNOSIS — I517 Cardiomegaly: Secondary | ICD-10-CM | POA: Diagnosis not present

## 2015-04-14 DIAGNOSIS — I313 Pericardial effusion (noninflammatory): Secondary | ICD-10-CM | POA: Diagnosis not present

## 2015-04-21 ENCOUNTER — Encounter: Payer: Self-pay | Admitting: Cardiology

## 2015-04-21 NOTE — Progress Notes (Signed)
HPI: FU atrial fibrillation and AS. Echocardiogram in January of 2015 showed normal LV function, moderate to severe left atrial enlargement, mild right atrial enlargement and mildly reduced RV function. The aortic valve was severely thickened. There was mild aortic stenosis by Doppler with a mean gradient of 18 mm of mercury. Also with atrial fibrillation treated with rate control and anticoagulation. Holter monitor in March of 2015 showed atrial fibrillation with rate controlled. Echo 8/16 showed normal LV function, elevated LV filling pressure, severe AS with mean gradient of 43 mmHg, severe LAE, mild RAE. Seen recently with increased fatigue and dyspnea. Echo repeated 12/16 and showed normal LV function and severe AS with mean gradient 54 mmHg. Since last seen, She has some dyspnea on exertion but no orthopnea, PND, pedal edema, chest pain, palpitations or syncope.  Current Outpatient Prescriptions  Medication Sig Dispense Refill  . diltiazem (CARDIZEM CD) 180 MG 24 hr capsule Take 1 capsule (180 mg total) by mouth daily. 30 capsule 12  . ELIQUIS 5 MG TABS tablet     . fluticasone (FLONASE) 50 MCG/ACT nasal spray     . furosemide (LASIX) 20 MG tablet Take 20 mg by mouth daily.     Marland Kitchen levothyroxine (SYNTHROID, LEVOTHROID) 50 MCG tablet Take 50 mcg by mouth daily. 75 MCG ON mwf AND 50 MCG ALL OTHERS    . losartan (COZAAR) 50 MG tablet Take 1 tablet (50 mg total) by mouth daily. 90 tablet 3  . SYNTHROID 75 MCG tablet Take 1 tablet by mouth. 75mg  Monday Wednesday Friday 50mg  Tuesday Thursday Saturday Sunday    . tretinoin (RETIN-A) 0.05 % cream Apply a small amount to skin daily    . triamcinolone cream (KENALOG) 0.1 % Apply 1 application topically. Few times a week    . venlafaxine XR (EFFEXOR-XR) 37.5 MG 24 hr capsule Take 1 capsule by mouth daily. Take 1 tab daily     No current facility-administered medications for this visit.     Past Medical History  Diagnosis Date  . FIBRILLATION,  ATRIAL   . HYPERLIPIDEMIA   . HYPERTENSION, BENIGN SYSTEMIC   . ANEMIA NOS   . BICEPS TENDON RUPTURE, LEFT   . Closed anterior dislocation of humerus   . DEPRESSION, MAJOR, RECURRENT   . FATIGUE, CHRONIC   . HYPOTHYROIDISM NOS   . OBESITY NOS   . OSTEOARTHRITIS, MULTI SITES   . Rosacea   . SUBACROMIAL BURSITIS, RIGHT   . Breast cancer [facility])     Past Surgical History  Procedure Laterality Date  . Mastectomy Right   . Tonsillectomy    . Abdominal hysterectomy      Social History   Social History  . Marital Status: Married    Spouse Name: N/A  . Number of Children: 2  . Years of Education: N/A   Occupational History  .     Social History Main Topics  . Smoking status: Never Smoker   . Smokeless tobacco: Not on file  . Alcohol Use: Yes     Comment: 3 drinks per night  . Drug Use: Not on file  . Sexual Activity: Not on file   Other Topics Concern  . Not on file   Social History Narrative    ROS: no fevers or chills, productive cough, hemoptysis, dysphasia, odynophagia, melena, hematochezia, dysuria, hematuria, rash, seizure activity, orthopnea, PND, pedal edema, claudication. Remaining systems are negative.  Physical Exam: Well-developed obese in no acute distress.  Skin is  warm and dry.  HEENT is normal.  Neck is supple.  Chest is clear to auscultation with normal expansion.  Cardiovascular exam is irregular, 3/6 systolic murmur, S2 is diminished. Abdominal exam nontender or distended. No masses palpated. Extremities show no edema. neuro grossly intact

## 2015-04-24 ENCOUNTER — Ambulatory Visit
Admission: RE | Admit: 2015-04-24 | Discharge: 2015-04-24 | Disposition: A | Discharge status: Home / Self Care | Payer: Medicare Other | Source: Ambulatory Visit | Attending: Internal Medicine | Admitting: Internal Medicine

## 2015-04-24 ENCOUNTER — Other Ambulatory Visit: Payer: Self-pay | Admitting: Internal Medicine

## 2015-04-24 DIAGNOSIS — R059 Cough, unspecified: Secondary | ICD-10-CM

## 2015-04-24 DIAGNOSIS — R05 Cough: Secondary | ICD-10-CM

## 2015-04-27 ENCOUNTER — Ambulatory Visit (INDEPENDENT_AMBULATORY_CARE_PROVIDER_SITE_OTHER): Payer: Medicare Other | Admitting: Cardiology

## 2015-04-27 ENCOUNTER — Other Ambulatory Visit: Payer: Self-pay | Admitting: *Deleted

## 2015-04-27 ENCOUNTER — Encounter: Payer: Self-pay | Admitting: *Deleted

## 2015-04-27 ENCOUNTER — Encounter: Payer: Self-pay | Admitting: Cardiology

## 2015-04-27 VITALS — BP 124/76 | HR 85 | Ht 62.0 in | Wt 175.9 lb

## 2015-04-27 DIAGNOSIS — I350 Nonrheumatic aortic (valve) stenosis: Secondary | ICD-10-CM | POA: Diagnosis not present

## 2015-04-27 DIAGNOSIS — I482 Chronic atrial fibrillation, unspecified: Secondary | ICD-10-CM

## 2015-04-27 DIAGNOSIS — Z01818 Encounter for other preprocedural examination: Secondary | ICD-10-CM | POA: Diagnosis not present

## 2015-04-27 DIAGNOSIS — I10 Essential (primary) hypertension: Secondary | ICD-10-CM

## 2015-04-27 NOTE — Assessment & Plan Note (Signed)
Patient is noted to have severe aortic stenosis on echocardiography which is worse compared to 8/16. She does note dyspnea on exertion which is somewhat worse. I feel we should proceed with aortic valve replacement. I discussed this with patient and her husband and she agrees. She will need a right and left cardiac catheterization. We will arrange with Dr. Burt Knack. The risks and benefits were discussed and she agrees to proceed. Hold anticoagulation 2 days prior to the procedure and resume the day after. I will arrange for her to be seen in TAVR clinic by either Dr Burt Knack or Dr Angelena Form; she will also be referred to either Dr Cyndia Bent or Dr Roxy Manns to help decide whether TAVR or open surgical replacement would be appropriate.

## 2015-04-27 NOTE — Patient Instructions (Addendum)
Medication Instructions:   NO CHANGE  Testing/Procedures:  Your physician has requested that you have a cardiac catheterization. Cardiac catheterization is used to diagnose and/or treat various heart conditions. Doctors may recommend this procedure for a number of different reasons. The most common reason is to evaluate chest pain. Chest pain can be a symptom of coronary artery disease (CAD), and cardiac catheterization can show whether plaque is narrowing or blocking your heart's arteries. This procedure is also used to evaluate the valves, as well as measure the blood flow and oxygen levels in different parts of your heart. For further information please visit [email or web address]. Please follow instruction sheet, as given.    Follow-Up:  Your physician recommends that you schedule a follow-up appointment in: [place]   If you need a refill on your cardiac medications before your next appointment, please call your pharmacy.   REFERRAL TO DR Burt Knack IN THE TAVR CLINIC FOR AORTIC STENOSIS

## 2015-04-27 NOTE — Assessment & Plan Note (Signed)
Blood pressure controlled. Continue present medications. 

## 2015-04-27 NOTE — Assessment & Plan Note (Signed)
Patient remains in permanent atrial fibrillation. Continue Cardizem for rate control. Continue apixaban. We will need to discontinue anticoagulation 2 days prior to catheterization and resume the day after.

## 2015-04-27 NOTE — Assessment & Plan Note (Signed)
Management per primary care. 

## 2015-05-03 ENCOUNTER — Telehealth: Payer: Self-pay | Admitting: *Deleted

## 2015-05-03 NOTE — Telephone Encounter (Signed)
Spoke with [name] regarding the consultation with Dr. Burt Knack [facility]).  I received a reply from Theodosia Quay , Dr. Antionette Char nurse stating Dr. Burt Knack is dong Tara Mack's cardiac cath and that is the beginning of her consultaion.  I explained this to Mr. Youngblood and he voiced his understanding and stated he would inform his wife.

## 2015-05-10 LAB — CBC
HCT: 41.2 % (ref 36.0–46.0)
HEMOGLOBIN: 13.9 g/dL (ref 12.0–15.0)
MCH: 33.1 pg (ref 26.0–34.0)
MCHC: 33.7 g/dL (ref 30.0–36.0)
MCV: 98.1 fL (ref 78.0–100.0)
MPV: 10.3 fL (ref 8.6–12.4)
Platelets: 192 10*3/uL (ref 150–400)
RBC: 4.2 MIL/uL (ref 3.87–5.11)
RDW: 14.2 % (ref 11.5–15.5)
WBC: 8.7 10*3/uL (ref 4.0–10.5)

## 2015-05-10 LAB — PROTIME-INR
INR: 1.46 (ref <1.50)
Prothrombin Time: 17.9 seconds — ABNORMAL HIGH (ref 11.6–15.2)

## 2015-05-11 LAB — BASIC METABOLIC PANEL
BUN: 15 mg/dL (ref 7–25)
CHLORIDE: 99 mmol/L (ref 98–110)
CO2: 27 mmol/L (ref 20–31)
Calcium: 9.2 mg/dL (ref 8.6–10.4)
Creat: 0.66 mg/dL (ref 0.60–0.88)
Glucose, Bld: 89 mg/dL (ref 65–99)
POTASSIUM: 3.6 mmol/L (ref 3.5–5.3)
Sodium: 139 mmol/L (ref 135–146)

## 2015-05-15 ENCOUNTER — Encounter (HOSPITAL_COMMUNITY)
Admission: RE | Disposition: A | Discharge status: Home / Self Care | Payer: Self-pay | Source: Ambulatory Visit | Attending: Cardiovascular Disease

## 2015-05-15 ENCOUNTER — Ambulatory Visit (HOSPITAL_COMMUNITY)
Admission: RE | Admit: 2015-05-15 | Discharge: 2015-05-15 | Disposition: A | Discharge status: Home / Self Care | Payer: Medicare Other | Source: Ambulatory Visit | Attending: Cardiovascular Disease | Admitting: Cardiovascular Disease

## 2015-05-15 DIAGNOSIS — I4891 Unspecified atrial fibrillation: Secondary | ICD-10-CM | POA: Insufficient documentation

## 2015-05-15 DIAGNOSIS — E8770 Fluid overload, unspecified: Secondary | ICD-10-CM | POA: Insufficient documentation

## 2015-05-15 DIAGNOSIS — I2510 Atherosclerotic heart disease of native coronary artery without angina pectoris: Secondary | ICD-10-CM | POA: Insufficient documentation

## 2015-05-15 DIAGNOSIS — I350 Nonrheumatic aortic (valve) stenosis: Secondary | ICD-10-CM | POA: Diagnosis present

## 2015-05-15 DIAGNOSIS — I272 Other secondary pulmonary hypertension: Secondary | ICD-10-CM | POA: Diagnosis not present

## 2015-05-15 HISTORY — PX: CARDIAC CATHETERIZATION: SHX172

## 2015-05-15 LAB — POCT I-STAT 3, VENOUS BLOOD GAS (G3P V)
Bicarbonate: 25.6 mEq/L — ABNORMAL HIGH (ref 20.0–24.0)
O2 SAT: 62 %
TCO2: 27 mmol/L (ref 0–100)
pCO2, Ven: 42.3 mmHg — ABNORMAL LOW (ref 45.0–50.0)
pH, Ven: 7.39 — ABNORMAL HIGH (ref 7.250–7.300)
pO2, Ven: 32 mmHg (ref 30.0–45.0)

## 2015-05-15 LAB — POCT I-STAT 3, ART BLOOD GAS (G3+)
Acid-Base Excess: 5 mmol/L — ABNORMAL HIGH (ref 0.0–2.0)
Bicarbonate: 30.1 mEq/L — ABNORMAL HIGH (ref 20.0–24.0)
O2 Saturation: 95 %
PH ART: 7.449 (ref 7.350–7.450)
TCO2: 31 mmol/L (ref 0–100)
pCO2 arterial: 43.4 mmHg (ref 35.0–45.0)
pO2, Arterial: 74 mmHg — ABNORMAL LOW (ref 80.0–100.0)

## 2015-05-15 SURGERY — RIGHT/LEFT HEART CATH AND CORONARY ANGIOGRAPHY

## 2015-05-15 MED ORDER — HEPARIN SODIUM (PORCINE) 1000 UNIT/ML IJ SOLN
INTRAMUSCULAR | Status: DC | PRN
  Administered 2015-05-15: 4000 [IU] via INTRAVENOUS

## 2015-05-15 MED ORDER — LIDOCAINE HCL (PF) 1 % IJ SOLN
INTRAMUSCULAR | Status: AC
  Filled 2015-05-15: qty 30

## 2015-05-15 MED ORDER — ACETAMINOPHEN 325 MG PO TABS: 650.0000 mg | ORAL_TABLET | ORAL | Status: DC | PRN

## 2015-05-15 MED ORDER — SODIUM CHLORIDE 0.9 % IV SOLN: 250.0000 mL | INTRAVENOUS | Status: DC | PRN

## 2015-05-15 MED ORDER — SODIUM CHLORIDE 0.9 % IJ SOLN: 3.0000 mL | Freq: Two times a day (BID) | INTRAMUSCULAR | Status: DC

## 2015-05-15 MED ORDER — MIDAZOLAM HCL 2 MG/2ML IJ SOLN
INTRAMUSCULAR | Status: AC
  Filled 2015-05-15: qty 2

## 2015-05-15 MED ORDER — SODIUM CHLORIDE 0.9 % IJ SOLN: 3.0000 mL | INTRAMUSCULAR | Status: DC | PRN

## 2015-05-15 MED ORDER — VERAPAMIL HCL 2.5 MG/ML IV SOLN
INTRAVENOUS | Status: DC | PRN
  Administered 2015-05-15: 10:00:00 via INTRA_ARTERIAL

## 2015-05-15 MED ORDER — ASPIRIN 81 MG PO CHEW: 81.0000 mg | CHEWABLE_TABLET | ORAL | Status: DC

## 2015-05-15 MED ORDER — ONDANSETRON HCL 4 MG/2ML IJ SOLN: 4.0000 mg | Freq: Four times a day (QID) | INTRAMUSCULAR | Status: DC | PRN

## 2015-05-15 MED ORDER — SODIUM CHLORIDE 0.9 % WEIGHT BASED INFUSION: 1.0000 mL/kg/h | INTRAVENOUS | Status: DC

## 2015-05-15 MED ORDER — ENALAPRILAT 1.25 MG/ML IV SOLN
INTRAVENOUS | Status: AC
  Filled 2015-05-15: qty 1

## 2015-05-15 MED ORDER — IOHEXOL 350 MG/ML SOLN
INTRAVENOUS | Status: DC | PRN
  Administered 2015-05-15: 90 mL via INTRA_ARTERIAL

## 2015-05-15 MED ORDER — SODIUM CHLORIDE 0.9 % WEIGHT BASED INFUSION
3.0000 mL/kg/h | INTRAVENOUS | Status: DC
  Administered 2015-05-15: 3 mL/kg/h via INTRAVENOUS

## 2015-05-15 MED ORDER — OXYCODONE-ACETAMINOPHEN 5-325 MG PO TABS
1.0000 | ORAL_TABLET | ORAL | Status: DC | PRN
Start: 2015-05-15 — End: 2015-05-15

## 2015-05-15 MED ORDER — SODIUM CHLORIDE 0.9 % IV SOLN: INTRAVENOUS | Status: DC

## 2015-05-15 MED ORDER — HEPARIN (PORCINE) IN NACL 2-0.9 UNIT/ML-% IJ SOLN
INTRAMUSCULAR | Status: AC
  Filled 2015-05-15: qty 500

## 2015-05-15 MED ORDER — FENTANYL CITRATE (PF) 100 MCG/2ML IJ SOLN
INTRAMUSCULAR | Status: DC | PRN
  Administered 2015-05-15: 25 ug via INTRAVENOUS

## 2015-05-15 MED ORDER — ASPIRIN 81 MG PO CHEW
CHEWABLE_TABLET | ORAL | Status: AC
  Administered 2015-05-15: 81 mg
  Filled 2015-05-15: qty 1

## 2015-05-15 MED ORDER — HEPARIN SODIUM (PORCINE) 1000 UNIT/ML IJ SOLN
INTRAMUSCULAR | Status: AC
  Filled 2015-05-15: qty 1

## 2015-05-15 MED ORDER — METOPROLOL TARTRATE 1 MG/ML IV SOLN
INTRAVENOUS | Status: AC
  Filled 2015-05-15: qty 5

## 2015-05-15 MED ORDER — VERAPAMIL HCL 2.5 MG/ML IV SOLN
INTRAVENOUS | Status: AC
  Filled 2015-05-15: qty 2

## 2015-05-15 MED ORDER — LIDOCAINE HCL (PF) 1 % IJ SOLN
INTRAMUSCULAR | Status: DC | PRN
  Administered 2015-05-15: 11:00:00

## 2015-05-15 MED ORDER — METOPROLOL TARTRATE 1 MG/ML IV SOLN
INTRAVENOUS | Status: DC | PRN
  Administered 2015-05-15: 5 mg via INTRAVENOUS

## 2015-05-15 MED ORDER — MIDAZOLAM HCL 2 MG/2ML IJ SOLN
INTRAMUSCULAR | Status: DC | PRN
  Administered 2015-05-15: 1 mg via INTRAVENOUS

## 2015-05-15 MED ORDER — FENTANYL CITRATE (PF) 100 MCG/2ML IJ SOLN
INTRAMUSCULAR | Status: AC
  Filled 2015-05-15: qty 2

## 2015-05-15 SURGICAL SUPPLY — 14 items
CATH BALLN WEDGE 5F 110CM (CATHETERS) ×1 IMPLANT
CATH INFINITI 5FR ANG PIGTAIL (CATHETERS) ×2 IMPLANT
CATH INFINITI JR4 5F (CATHETERS) ×2 IMPLANT
DEVICE RAD COMP TR BAND LRG (VASCULAR PRODUCTS) ×2 IMPLANT
GLIDESHEATH SLEND SS 6F .021 (SHEATH) ×2 IMPLANT
KIT HEART LEFT (KITS) ×2 IMPLANT
PACK CARDIAC CATHETERIZATION (CUSTOM PROCEDURE TRAY) ×2 IMPLANT
SHEATH FAST CATH BRACH 5F 5CM (SHEATH) ×1 IMPLANT
SYR MEDRAD MARK V 150ML (SYRINGE) ×2 IMPLANT
TRANSDUCER W/STOPCOCK (MISCELLANEOUS) ×3 IMPLANT
TUBING CIL FLEX 10 FLL-RA (TUBING) ×2 IMPLANT
WIRE HI TORQ VERSACORE-J 145CM (WIRE) ×1 IMPLANT
WIRE MICROINTRODUCER 60CM (WIRE) ×1 IMPLANT
WIRE SAFE-T 1.5MM-J .035X260CM (WIRE) ×2 IMPLANT

## 2015-05-15 NOTE — Discharge Instructions (Signed)
Radial Site Care °Refer to this sheet in the next few weeks. These instructions provide you with information about caring for yourself after your procedure. Your health care provider may also give you more specific instructions. Your treatment has been planned according to current medical practices, but problems sometimes occur. Call your health care provider if you have any problems or questions after your procedure. °WHAT TO EXPECT AFTER THE PROCEDURE °After your procedure, it is typical to have the following: °· Bruising at the radial site that usually fades within 1-2 weeks. °· Blood collecting in the tissue (hematoma) that may be painful to the touch. It should usually decrease in size and tenderness within 1-2 weeks. °HOME CARE INSTRUCTIONS °· Take medicines only as directed by your health care provider. °· You may shower 24-48 hours after the procedure or as directed by your health care provider. Remove the bandage (dressing) and gently wash the site with plain soap and water. Pat the area dry with a clean towel. Do not rub the site, because this may cause bleeding. °· Do not take baths, swim, or use a hot tub until your health care provider approves. °· Check your insertion site every day for redness, swelling, or drainage. °· Do not apply powder or lotion to the site. °· Do not flex or bend the affected arm for 24 hours or as directed by your health care provider. °· Do not push or pull heavy objects with the affected arm for 24 hours or as directed by your health care provider. °· Do not lift over 10 lb (4.5 kg) for 5 days after your procedure or as directed by your health care provider. °· Ask your health care provider when it is okay to: °¨ Return to work or school. °¨ Resume usual physical activities or sports. °¨ Resume sexual activity. °· Do not drive home if you are discharged the same day as the procedure. Have someone else drive you. °· You may drive 24 hours after the procedure unless otherwise  instructed by your health care provider. °· Do not operate machinery or power tools for 24 hours after the procedure. °· If your procedure was done as an outpatient procedure, which means that you went home the same day as your procedure, a responsible adult should be with you for the first 24 hours after you arrive home. °· Keep all follow-up visits as directed by your health care provider. This is important. °SEEK MEDICAL CARE IF: °· You have a fever. °· You have chills. °· You have increased bleeding from the radial site. Hold pressure on the site. °SEEK IMMEDIATE MEDICAL CARE IF: °· You have unusual pain at the radial site. °· You have redness, warmth, or swelling at the radial site. °· You have drainage (other than a small amount of blood on the dressing) from the radial site. °· The radial site is bleeding, and the bleeding does not stop after 30 minutes of holding steady pressure on the site. °· Your arm or hand becomes pale, cool, tingly, or numb. °  °This information is not intended to replace advice given to you by your health care provider. Make sure you discuss any questions you have with your health care provider. °  °Document Released: 05/25/2010 Document Revised: 05/13/2014 Document Reviewed: 11/08/2013 °[employer]vier Interactive Patient Education ©2016 [employer] ° °

## 2015-05-16 ENCOUNTER — Encounter (HOSPITAL_COMMUNITY): Payer: Self-pay | Admitting: Cardiovascular Disease

## 2015-05-24 ENCOUNTER — Encounter: Payer: Self-pay | Admitting: Surgery

## 2015-05-24 ENCOUNTER — Institutional Professional Consult (permissible substitution) (INDEPENDENT_AMBULATORY_CARE_PROVIDER_SITE_OTHER): Payer: Medicare Other | Admitting: Surgery

## 2015-05-24 VITALS — BP 110/64 | HR 104 | Resp 16 | Ht 62.0 in | Wt 165.0 lb

## 2015-05-24 DIAGNOSIS — I482 Chronic atrial fibrillation, unspecified: Secondary | ICD-10-CM

## 2015-05-24 DIAGNOSIS — R0602 Shortness of breath: Secondary | ICD-10-CM

## 2015-05-24 DIAGNOSIS — I350 Nonrheumatic aortic (valve) stenosis: Secondary | ICD-10-CM | POA: Diagnosis not present

## 2015-05-25 ENCOUNTER — Other Ambulatory Visit: Payer: Self-pay | Admitting: *Deleted

## 2015-05-25 DIAGNOSIS — I350 Nonrheumatic aortic (valve) stenosis: Secondary | ICD-10-CM

## 2015-05-28 ENCOUNTER — Encounter: Payer: Self-pay | Admitting: Surgery

## 2015-05-28 NOTE — Progress Notes (Signed)
Patient ID: Tara Mack, female   DOB: 1930/10/14, 82 y.o.   MRN: [id number]  [facility] CONSULTATION REPORT  Referring Provider is Stanford Breed, Denice Bors, MD PCP is Mathews Argyle, MD  Chief Complaint  Patient presents with  . Aortic Stenosis    severe..eval for TAVR vs CONVENTIONAL AVR.... ECHO 04/14/15.Marland KitchenCATH 05/15/15  . Shortness of Breath    HPI:  The patient is an 82 year old woman with atrial fibrillation, hypertension, hyperlipidemia and aortic stenosis that was mild by doppler in January 2015 with a mean gradient of 18 mm Hg. A follow up echo in 12/2014 showed progression of aortic stenosis with severely calcified leaflets and a mean gradient of 43 mm Hg. Her next echo on 04/14/2015 showed a mean gradient of 54 mm Hg. She underwent R/L heart cath on 05/15/2015 showing mild non-obstructive coronary disease with a mean aortic valve gradient of 25 mm Hg and a peak of 37 mm Hg with moderate pulmonary hypertension. She reports that she has dyspnea on exertion and decreased energy but has had no chest pain, or dizziness.  She lives at home with her husband, Tara Mack who used to be the head of the IRB at [facility]. She is not very active due to severe degenerative arthritis in her knees.     Past Medical History  Diagnosis Date  . FIBRILLATION, ATRIAL   . HYPERLIPIDEMIA   . HYPERTENSION, BENIGN SYSTEMIC   . ANEMIA NOS   . BICEPS TENDON RUPTURE, LEFT   . Closed anterior dislocation of humerus   . DEPRESSION, MAJOR, RECURRENT   . FATIGUE, CHRONIC   . HYPOTHYROIDISM NOS   . OBESITY NOS   . OSTEOARTHRITIS, MULTI SITES   . Rosacea   . SUBACROMIAL BURSITIS, RIGHT   . Breast cancer [facility])     Past Surgical History  Procedure Laterality Date  . Mastectomy Right   . Tonsillectomy    . Abdominal hysterectomy    . Cardiac catheterization N/A 05/15/2015    Procedure: Right/Left Heart Cath and Coronary  Angiography;  Surgeon: Sherren Mocha, MD;  Location: [facility];  Service: Cardiovascular;  Laterality: N/A;    Family History  Problem Relation Age of Onset  . CAD Mother     Social History   Social History  . Marital Status: Married    Spouse Name: N/A  . Number of Children: 2  . Years of Education: N/A   Occupational History  .     Social History Main Topics  . Smoking status: Never Smoker   . Smokeless tobacco: Not on file  . Alcohol Use: Yes     Comment: 3 drinks per night  . Drug Use: Not on file  . Sexual Activity: Not on file   Other Topics Concern  . Not on file   Social History Narrative    Current Outpatient Prescriptions  Medication Sig Dispense Refill  . acetaminophen (TYLENOL) 500 MG tablet Take 1,000 mg by mouth 2 (two) times daily as needed for mild pain or headache.    . Dextromethorphan-Guaifenesin (CORICIDIN HBP CONGESTION/COUGH PO) Take by mouth.    . diltiazem (CARDIZEM CD) 180 MG 24 hr capsule Take 1 capsule (180 mg total) by mouth daily. 30 capsule 12  . ELIQUIS 5 MG TABS tablet Take 5 mg by mouth 2 (two) times daily.     . fluticasone (FLONASE) 50 MCG/ACT nasal spray Place 2  sprays into both nostrils daily.     . furosemide (LASIX) 20 MG tablet Take 20 mg by mouth daily.     Marland Kitchen levothyroxine (SYNTHROID, LEVOTHROID) 50 MCG tablet Take 50 mcg by mouth daily. 75 MCG ON mwf AND 50 MCG ALL OTHERS    . losartan (COZAAR) 50 MG tablet Take 1 tablet (50 mg total) by mouth daily. 90 tablet 3  . Omega-3 Fatty Acids (FISH OIL) 1000 MG CAPS Take 1,000 mg by mouth 2 (two) times daily.    Marland Kitchen SYNTHROID 75 MCG tablet Take 1 tablet by mouth. 75mg  Monday Wednesday Friday 50mg  Tuesday Thursday Saturday Sunday    . tretinoin (RETIN-A) 0.05 % cream Apply 1 application topically daily as needed (for skin). Apply a small amount to skin daily    . venlafaxine XR (EFFEXOR-XR) 37.5 MG 24 hr capsule Take 37.5 mg by mouth daily.      No current facility-administered  medications for this visit.    No Known Allergies    Review of Systems:   General:  normal appetite, decreaed energy, no weight gain, no weight loss, no fever  Cardiac:  no chest pain with exertion, no chest pain at rest, moderate SOB with moderate exertion, no resting SOB, no PND, no orthopnea, frequent palpitations, persistent atrial fibrillation, some LE edema, no dizzy spells, no syncope  Respiratory:  exertional shortness of breath, no home oxygen, no productive cough, no dry cough, no bronchitis, no wheezing, no hemoptysis, no asthma, no pain with inspiration or cough, no sleep apnea, no CPAP at night  GI:   no difficulty swallowing, no reflux, no frequent heartburn, no hiatal hernia, no abdominal pain, has constipation, no diarrhea, no hematochezia, no hematemesis, no melena  GU:   no dysuria,  no frequency, no urinary tract infection, no hematuria, no kidney stones, no kidney disease  Vascular:  has pain suggestive of claudication, no pain in feet, no leg cramps, no varicose veins, no DVT, no non-healing foot ulcer  Neuro:   no stroke, no TIA's, no seizures, no headaches, notemporary blindness one eye,  no slurred speech, no peripheral neuropathy, no chronic pain, no instability of gait, no memory/cognitive dysfunction  Musculoskeletal: has arthritis in both knees, no joint swelling, no myalgias, has difficulty walking, decreased mobility   Skin:   no rash, no itching, no skin infections, no pressure sores or ulcerations  Psych:   no anxiety, no depression, no nervousness, no unusual recent stress  Eyes:   no blurry vision, no floaters, no recent vision changes, wears glasses or contacts  ENT:   no hearing loss, no loose or painful teeth, no dentures, last saw dentist couple months ago  Hematologic:  has easy bruising, no abnormal bleeding, no clotting disorder, no frequent epistaxis  Endocrine:  no diabetes, does not check CBG's at home           Physical Exam:   BP 110/64  mmHg  Pulse 104  Resp 16  Ht 5\' 2"  (1.575 m)  Wt 165 lb (74.844 kg)  BMI 30.17 kg/m2  SpO2 93%  General:  Elderly but  well-appearing  HEENT:  Unremarkable , NCAT, PERLA, EOMI, oropharynx clear  Neck:   no JVD, no bruits, no adenopathy or thyromegaly  Chest:   clear to auscultation, symmetrical breath sounds, no wheezes, no rhonchi   CV:   IRRR, grade III/VI crescendo/decrescendo murmur heard best at RSB,  no diastolic murmur  Abdomen:  soft, non-tender, no masses or organomegaly  Extremities:  warm, well-perfused, pulses not palpable in feet, no LE edema  Rectal/GU  Deferred  Neuro:   Grossly non-focal and symmetrical throughout  Skin:   Clean and dry, no rashes, no breakdown   Diagnostic Tests:              [facility]            [address], [place] [address]              [phone]  ------------------------------------------------------------------- Transthoracic Echocardiography  Patient:  Maddyx, Beames MR #:    [id number] Study Date: 04/14/2015 Gender:   F Age:    72 Height:   157.5 cm Weight:   81.6 kg BSA:    1.92 m^2 Pt. Status: Room:  SONOGRAPHER Charlann Noss, RDCS ATTENDING  Dorris Carnes, M.D. ORDERING   Kilroy, Luke K REFERRING  Gaston, Luke K PERFORMING  [facility], Outpatient  cc:  ------------------------------------------------------------------- LV EF: 55% -  60%  ------------------------------------------------------------------- Indications:   Aortic stenosis 424.1.  ------------------------------------------------------------------- History:  PMH: Acquired from the patient and from the patient&'s chart. Fatigue, dyspnea, and bilateral lower extremity edema. Atrial fibrillation. Aortic valve disease. Risk factors: Hypertension. Obese.  ------------------------------------------------------------------- Study  Conclusions  - Left ventricle: The cavity size was normal. Wall thickness was increased in a pattern of mild LVH. Systolic function was normal. The estimated ejection fraction was in the range of 55% to 60%. Wall motion was normal; there were no regional wall motion abnormalities. Doppler parameters are consistent with high ventricular filling pressure. - Aortic valve: Valve mobility was severely restricted. There was severe stenosis. There was trivial regurgitation. - Mitral valve: Calcified annulus. There was moderate regurgitation. - Left atrium: The atrium was severely dilated. - Right atrium: The atrium was moderately dilated. - Pulmonary arteries: Systolic pressure was moderately increased. - Pericardium, extracardiac: A trivial pericardial effusion was identified.  Impressions:  - Normal LV systolic function; mild LVH; elevated LV filling pressure; biatrial enlargement; thickened aortic valve with severe AS (mean gradient 54 mmHg) and trace AI; moderate MR; mild TR; moderately elevated pulmonary pressure.  ------------------------------------------------------------------- Labs, prior tests, procedures, and surgery: Echocardiography (August 2016).  Transthoracic echocardiography. M-mode, complete 2D, spectral Doppler, and color Doppler. Birthdate: Patient birthdate: 01-09-1931. Age: Patient is 82 yr old. Sex: Gender: female. BMI: 32.9 kg/m^2. Blood pressure:   132/84 Patient status: Outpatient. Study date: Study date: 04/14/2015. Study time: 11:52 AM. Location: [facility]  -------------------------------------------------------------------  ------------------------------------------------------------------- Left ventricle: The cavity size was normal. Wall thickness was increased in a pattern of mild LVH. Systolic function was normal. The estimated ejection fraction was in the range of 55% to 60%. Wall motion was normal;  there were no regional wall motion abnormalities. The study was not technically sufficient to allow evaluation of LV diastolic dysfunction due to atrial fibrillation. Doppler parameters are consistent with high ventricular filling pressure.  ------------------------------------------------------------------- Aortic valve:  Trileaflet; mildly thickened leaflets. Valve mobility was severely restricted. Doppler:  There was severe stenosis.  There was trivial regurgitation.  VTI ratio of LVOT to aortic valve: 0.18. Indexed valve area (VTI): 0.29 cm^2/m^2. Mean velocity ratio of LVOT to aortic valve: 0.1. Indexed valve area (Vmean): 0.17 cm^2/m^2.  Mean gradient (S): 54 mm Hg.  ------------------------------------------------------------------- Aorta: Aortic root: The aortic root was normal in size.  ------------------------------------------------------------------- Mitral valve:  Calcified annulus. Mobility was not restricted. Doppler: Transvalvular velocity was within the normal  range. There was no evidence for stenosis. There was moderate regurgitation. Peak gradient (D): 11 mm Hg.  ------------------------------------------------------------------- Left atrium: The atrium was severely dilated.  ------------------------------------------------------------------- Right ventricle: The cavity size was normal. Systolic function was normal.  ------------------------------------------------------------------- Pulmonic valve:  Doppler: Transvalvular velocity was within the normal range. There was no evidence for stenosis. There was trivial regurgitation.  ------------------------------------------------------------------- Tricuspid valve:  Structurally normal valve.  Doppler: Transvalvular velocity was within the normal range. There was mild regurgitation.  ------------------------------------------------------------------- Pulmonary artery:  Systolic pressure was  moderately increased.  ------------------------------------------------------------------- Right atrium: The atrium was moderately dilated.  ------------------------------------------------------------------- Pericardium: A trivial pericardial effusion was identified.  ------------------------------------------------------------------- Systemic veins: Inferior vena cava: The vessel was normal in size.  ------------------------------------------------------------------- Measurements  Left ventricle             Value       Reference LV ID, ED, PLAX chordal    (L)   38.1 mm     43 - 52 LV ID, ES, PLAX chordal        25.7 mm     23 - 38 LV fx shortening, PLAX chordal     33  %      >=29 LV PW thickness, ED          11.9 mm     --------- IVS/LV PW ratio, ED          0.85       <=1.3 Stroke volume, 2D           46  ml     --------- Stroke volume/bsa, 2D         24  ml/m^2   --------- LV e&', lateral             9.76 cm/s    --------- LV E/e&', lateral            17.32       ---------  Ventricular septum           Value       Reference IVS thickness, ED           10.1 mm     ---------  LVOT                  Value       Reference LVOT ID, S               20  mm     --------- LVOT area               3.14 cm^2    --------- LVOT ID                20  mm     --------- LVOT peak velocity, S         69.2 cm/s    --------- LVOT mean velocity, S         46.6 cm/s    --------- LVOT VTI, S              14.6 cm     --------- Stroke volume (SV), LVOT DP      45.9 ml     --------- Cardiac output (Qs), LVOT DP      4.1  L/min     --------- Cardiac index (Qs/bsa), LVOT      2.1  L/(min-m^2) --------- DP Stroke index (SV/bsa), LVOT DP     23.8 ml/m^2   ---------  Aortic valve  Value       Reference Aortic valve mean velocity, S     454  cm/s    --------- Aortic valve VTI, S          82.6 cm     --------- Aortic mean gradient, S        54  mm Hg    --------- VTI ratio, LVOT/AV           0.18       --------- Aortic valve area/bsa, VTI       0.29 cm^2/m^2  --------- Velocity ratio, mean, LVOT/AV     0.1        --------- Aortic valve area/bsa, mean      0.17 cm^2/m^2  --------- velocity  Aorta                 Value       Reference Aortic root ID, ED           32  mm     ---------  Left atrium              Value       Reference LA ID, A-P, ES             42  mm     --------- LA ID/bsa, A-P             2.18 cm/m^2   <=2.2 LA volume, S              148  ml     --------- LA volume/bsa, S            76.9 ml/m^2   --------- LA volume, ES, 1-p A4C         107  ml     --------- LA volume/bsa, ES, 1-p A4C       55.6 ml/m^2   --------- LA volume, ES, 1-p A2C         178  ml     --------- LA volume/bsa, ES, 1-p A2C       92.5 ml/m^2   ---------  Mitral valve              Value       Reference Mitral E-wave peak velocity      169  cm/s    --------- Mitral A-wave peak velocity      54.2 cm/s    --------- Mitral deceleration time        162  ms     150 - 230 Mitral peak gradient, D        11  mm Hg    --------- Mitral E/A ratio, peak         3.1        ---------  Tricuspid valve            Value        Reference Tricuspid regurg peak velocity     320  cm/s    --------- Tricuspid peak RV-RA gradient     41  mm Hg    ---------  Right ventricle            Value       Reference RV s&', lateral, S           7.46 cm/s    ---------  Legend: (L) and (H) mark values outside specified reference range.  ------------------------------------------------------------------- Prepared and Electronically Authenticated by  Kirk Ruths 2016-12-16T10:05:51  Sherren Mocha, MD (Primary)  Procedures    Right/Left Heart Cath and Coronary Angiography    Conclusion    1. Severe aortic stenosis  2. Mild nonobstructive CAD 3. Normal LV function 4. Moderate pulmonary HTN  Cardiac surgical evaluation for TAVR versus conventional AVR    Indications    Severe aortic stenosis [I35.0 (ICD-10-CM)]    Technique and Indications    INDICATION: Severe aortic stenosis  PROCEDURAL DETAILS: There was an indwelling IV in a right antecubital vein. Using normal sterile technique, the IV was changed out for a 5 Fr brachial sheath over a 0.018 inch wire. The right wrist was then prepped, draped, and anesthetized with 1% lidocaine. Using the modified Seldinger technique a 5/6 French Slender sheath was placed in the right radial artery. Intra-arterial verapamil was administered through the radial artery sheath. IV heparin was administered after a JR4 catheter was advanced into the central aorta. A Swan-Ganz catheter was used for the right heart catheterization. Standard protocol was followed for recording of right heart pressures and sampling of oxygen saturations. Fick cardiac output was calculated. Standard Judkins catheters were used for selective coronary angiography, aortic root angiography, and left ventriculography. There were no immediate procedural complications. The patient was transferred to the post catheterization recovery area  for further monitoring.    Estimated blood loss <50 mL. There were no immediate complications during the procedure.    Coronary Findings    Dominance: Right   Left Main   . Ost LM to LM lesion, 25% stenosed. Calcified. The left main is mildly calcified. The distal vessel has 20-25% stenosis without significant obstruction     Left Anterior Descending   . Prox LAD lesion, 25% stenosed. Calcified. The LAD is mildly calcified. The vessel reaches the LV apex. There is a large first diagonal branch with no significant stenosis. There is mild irregularity of the LAD but no significant stenosis is present.     Left Circumflex  The left circumflex distribution is small and there is no significant stenosis present.     Right Coronary Artery  The RCA is a large, dominant vessel with mild calcification and no significant obstruction. The vessel supplies a large PDA and PLA branch.       Right Heart Pressures Hemodynamic findings consistent with aortic stenosis. Elevated LV EDP consistent with volume overload.    Wall Motion                 Left Heart    Aortic Valve , and no aortic valve regurgitation. The aortic valve is calcified. There is restricted aortic valve motion. Peak-to-Peak 37 mmHg, mean 25 mmHg, AVA 0.8 square cm    Coronary Diagrams    Diagnostic Diagram            Implants    Name ID Temporary Type Supply   No information to display    PACS Images    Show images for Cardiac catheterization     Link to Procedure Log    Procedure Log      Hemo Data       Most Recent Value   Fick Cardiac Output  3.94 L/min   Fick Cardiac Output Index  2.2 (L/min)/BSA   Aortic Mean Gradient  25.2 mmHg   Aortic Peak Gradient  37 mmHg   Aortic Valve Area  0.81   Aortic Value Area Index  0.45 cm2/BSA   RA A Wave  -99 mmHg   RA V Wave  16 mmHg   RA Mean  13 mmHg   RV Systolic Pressure  63 mmHg   RV Diastolic Pressure  2 mmHg   RV EDP   9 mmHg   PA Systolic Pressure  63 mmHg   PA Diastolic Pressure  26 mmHg   PA Mean  39 mmHg   PW A Wave  -99 mmHg   PW V Wave  22 mmHg   PW Mean  22 mmHg   AO Systolic Pressure  [id number] mmHg   AO Diastolic Pressure  78 mmHg   AO Mean  123XX123 mmHg   LV Systolic Pressure  0000000 mmHg   LV Diastolic Pressure  10 mmHg   LV EDP  16 mmHg   Arterial Occlusion Pressure Extended Systolic Pressure  [id number] mmHg   Arterial Occlusion Pressure Extended Diastolic Pressure  83 mmHg   Arterial Occlusion Pressure Extended Mean Pressure  105 mmHg   Left Ventricular Apex Extended Systolic Pressure  123XX123 mmHg   Left Ventricular Apex Extended Diastolic Pressure  13 mmHg   Left Ventricular Apex Extended EDP Pressure  19 mmHg   QP/QS  1   TPVR Index  17.74 HRUI   TSVR Index  45.48 HRUI   PVR SVR Ratio  0.2   TPVR/TSVR Ratio  0.39    Impression:  The patient has stage D severe, symptomatic aortic stenosis with NYHA class II symptoms of exertional shortness of breath and fatigue that has been progressing. I have personally reviewed her echo and cath. Her aortic valve is trileaflet with calcified leaflets with severe restricted mobility and a mean gradient of 54 mm Hg. I agree with Dr. Stanford Breed that her aortic valve should be replaced with severe stenosis and progressive symptoms. She is in the intermediate risk class at 82 years old with persistent atrial fibrillation and severe degenerative arthritis of her knees with limited mobility. She would have a slow recovery with surgical aortic valve replacement. I think TAVR would be a reasonable option for her.   The patient was counseled at length regarding treatment alternatives for management of severe symptomatic aortic stenosis. The risks and benefits of surgical intervention has been discussed in detail. Long-term prognosis with medical therapy was discussed. Alternative approaches such as conventional surgical aortic valve replacement, transcatheter  aortic valve replacement, and palliative medical therapy were compared and contrasted at length. This discussion was placed in the context of the patient's own specific clinical presentation and past medical history. All of their questions been addressed. The patient is eager to proceed with surgical management as soon as possible.  The patient has been advised of a variety of complications that might develop including but not limited to risks of death, stroke, paravalvular leak, aortic dissection or other major vascular complications, aortic annulus rupture, device embolization, cardiac rupture or perforation, mitral regurgitation, acute myocardial infarction, arrhythmia, heart block or bradycardia requiring permanent pacemaker placement, congestive heart failure, respiratory failure, renal failure, pneumonia, infection, other late complications related to structural valve deterioration or migration, or other complications that might ultimately cause a temporary or permanent loss of functional independence or other long term morbidity. She would like to proceed with further workup for consideration of TAVR.    Plan:  She will have a gated cardiac CT, CTA of the chest, abdomen and pelvis, PT evaluation and PFT's performed. After these have been performed she will return to see Dr. Roxy Manns for a second surgical consult and one of the structural heart valve cardiologists.    Gaye Pollack, MD 05/24/2015

## 2015-05-29 ENCOUNTER — Ambulatory Visit (HOSPITAL_COMMUNITY)
Admission: RE | Admit: 2015-05-29 | Discharge: 2015-05-29 | Disposition: A | Discharge status: Home / Self Care | Payer: Medicare Other | Source: Ambulatory Visit | Attending: Surgery | Admitting: Surgery

## 2015-05-29 ENCOUNTER — Ambulatory Visit: Payer: Medicare Other | Attending: Surgery | Admitting: Physical Therapy

## 2015-05-29 ENCOUNTER — Encounter: Payer: Self-pay | Admitting: Physical Therapy

## 2015-05-29 DIAGNOSIS — N261 Atrophy of kidney (terminal): Secondary | ICD-10-CM | POA: Insufficient documentation

## 2015-05-29 DIAGNOSIS — N289 Disorder of kidney and ureter, unspecified: Secondary | ICD-10-CM | POA: Diagnosis not present

## 2015-05-29 DIAGNOSIS — I77811 Abdominal aortic ectasia: Secondary | ICD-10-CM | POA: Diagnosis not present

## 2015-05-29 DIAGNOSIS — R918 Other nonspecific abnormal finding of lung field: Secondary | ICD-10-CM | POA: Diagnosis not present

## 2015-05-29 DIAGNOSIS — R531 Weakness: Secondary | ICD-10-CM | POA: Insufficient documentation

## 2015-05-29 DIAGNOSIS — I350 Nonrheumatic aortic (valve) stenosis: Secondary | ICD-10-CM | POA: Insufficient documentation

## 2015-05-29 DIAGNOSIS — M25561 Pain in right knee: Secondary | ICD-10-CM | POA: Insufficient documentation

## 2015-05-29 DIAGNOSIS — I358 Other nonrheumatic aortic valve disorders: Secondary | ICD-10-CM | POA: Insufficient documentation

## 2015-05-29 DIAGNOSIS — M25562 Pain in left knee: Secondary | ICD-10-CM | POA: Diagnosis present

## 2015-05-29 DIAGNOSIS — R269 Unspecified abnormalities of gait and mobility: Secondary | ICD-10-CM | POA: Insufficient documentation

## 2015-05-29 DIAGNOSIS — I517 Cardiomegaly: Secondary | ICD-10-CM | POA: Insufficient documentation

## 2015-05-29 IMAGING — CT CT HEART MORP W/ CTA COR W/ SCORE W/ CA W/CM &/OR W/O CM
1 of 14 series · 1 of 19 positions shown, 2 images · non-contrast
Comparison: No priors.

CLINICAL DATA: Aortic stenosis

EXAM:
Cardiac TAVR CT
TECHNIQUE: The patient was scanned on a Philips 256 scanner. A 120 kV
retrospective scan was triggered in the descending thoracic aorta at
111 HU's. Gantry rotation speed was 270 msecs and collimation was .9
mm. No beta blockade or nitro were given. The 3D data set was
reconstructed in 5% intervals of the R-R cycle. Systolic and
diastolic phases were analyzed on a dedicated work station using
MPR, MIP and VRT modes. The patient received 80 cc of contrast.

[Series 200: locator · axial · 0.35mm/px · z∈[-122,-122]mm · 1 of 1 slices shown, 2 images]
[im 1/1  vessel]
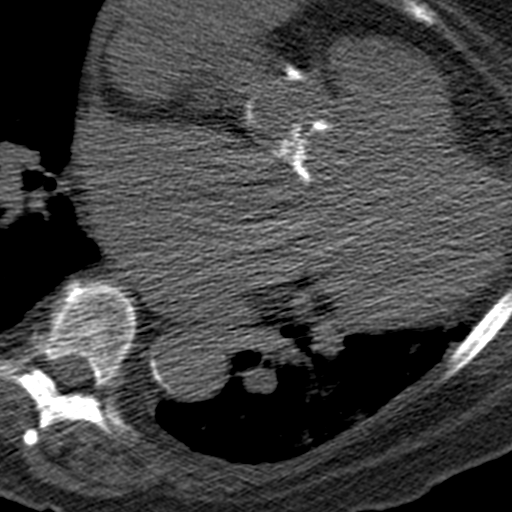
[im 1/1  lung]
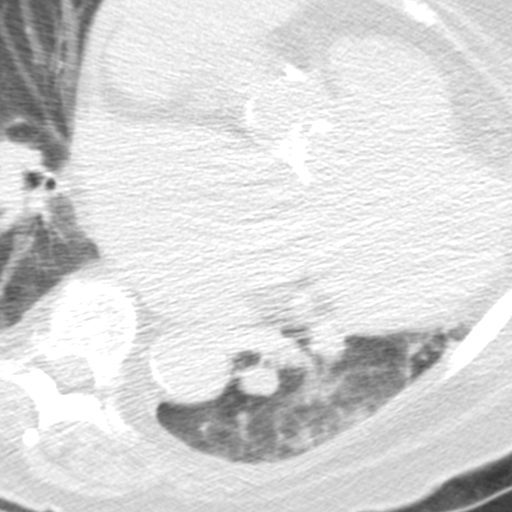

[1 of 19 positions shown; findings below may reference images not displayed]

FINDINGS: Aortic Valve: Tri-leaflet and heavily calcified. Moderate
calcification of the STJ

Aorta: Mild calcification of the arch and descending thoracic aorta.
Bovine Arch

Sinotubular Junction:  27 mm

Ascending Thoracic Aorta:  37 mm

Aortic Arch:  25 mm

Sinus of Valsalva Measurements:

Non-coronary:  28 mm

Right -coronary:  28 mm

Left -coronary:  27 mm

Coronary Artery Height above Annulus:

Left Main:  13.4 mm

Right Coronary:  13.8 mm

Virtual Basal Annulus Measurements:

Maximum/Minimum Diameter:  25 mm x 20.6 mm

Perimeter:  72 mm

Area:  400 mm2

Coronary Arteries:  Sufficient height above annulus for deployment

Optimum Fluoroscopic Angle for Delivery: Unable to determine but
appears to required steep caudal angulation in AP view
IMPRESSION: 1) Calcified tri-leaflet aortic valve with annulus 400 mm2 suitable
for a 23 mm Sapien 3 valve

2) Suitable coronary height above annulus for deployment

3) Bovine Arch with no contraindications to femoral or thoracic
delivery

4) No JAN thrombus

5) Angiographic angle for delivery difficult to determine but would
appear to need significant caudle angulation in AP view

Sonnleitner Pangratz

EXAM:
OVER-READ INTERPRETATION  CT CHEST

The following report is an over-read performed by radiologist Dr.
over-read does not include interpretation of cardiac or coronary
anatomy or pathology. The coronary calcium score/coronary CTA
interpretation by the cardiologist is attached.
FINDINGS: Noncardiac findings will be dictated under separate accession for
contemporaneously obtained CTA of the chest, abdomen and pelvis.
IMPRESSION: See separate dictation for contemporaneously obtained CTA of the
chest, abdomen and pelvis for full description of extracardiac
findings.

## 2015-05-29 MED ORDER — IOHEXOL 350 MG/ML SOLN
70.0000 mL | Freq: Once | INTRAVENOUS | Status: AC | PRN
  Administered 2015-05-29: 70 mL via INTRAVENOUS

## 2015-05-29 MED ORDER — IOHEXOL 350 MG/ML SOLN
80.0000 mL | Freq: Once | INTRAVENOUS | Status: AC | PRN
  Administered 2015-05-29: 80 mL via INTRAVENOUS

## 2015-05-29 MED ORDER — METOPROLOL TARTRATE 1 MG/ML IV SOLN
INTRAVENOUS | Status: AC
  Administered 2015-05-29: 5 mg
  Filled 2015-05-29: qty 5

## 2015-05-29 NOTE — Therapy (Signed)
[place], [place], [address] Phone: [phone]   Fax:  [phone]  Physical Therapy Evaluation  Patient Details  Name: Tara Mack MRN: [id number] Date of Birth: [birth year] Referring Provider: Dr. Arletha Grippe  Encounter Date: 05/29/2015      PT End of Session - 05/29/15 1654    Visit Number 1   PT Start Time 1450   PT Stop Time [id number]   PT Time Calculation (min) 49 min   Activity Tolerance Patient limited by pain  and SOB      Past Medical History  Diagnosis Date  . FIBRILLATION, ATRIAL   . HYPERLIPIDEMIA   . HYPERTENSION, BENIGN SYSTEMIC   . ANEMIA NOS   . BICEPS TENDON RUPTURE, LEFT   . Closed anterior dislocation of humerus   . DEPRESSION, MAJOR, RECURRENT   . FATIGUE, CHRONIC   . HYPOTHYROIDISM NOS   . OBESITY NOS   . OSTEOARTHRITIS, MULTI SITES   . Rosacea   . SUBACROMIAL BURSITIS, RIGHT   . Breast cancer [facility])     Past Surgical History  Procedure Laterality Date  . Mastectomy Right   . Tonsillectomy    . Abdominal hysterectomy    . Cardiac catheterization N/A 05/15/2015    Procedure: Right/Left Heart Cath and Coronary Angiography;  Surgeon: Sherren Mocha, MD;  Location: [facility];  Service: Cardiovascular;  Laterality: N/A;    There were no vitals filed for this visit.  Visit Diagnosis:  Abnormality of gait - Plan: PT plan of care cert/re-cert  Knee pain, bilateral - Plan: PT plan of care cert/re-cert  Generalized weakness - Plan: PT plan of care cert/re-cert  Severe aortic stenosis - Plan: PT plan of care cert/re-cert      Subjective Assessment - 05/29/15 1456    Subjective reports incr. DOE in the last year, especially with stair climbing and walking in the community and with housework, denies chest pain/dizziness, overall energy level has declined over last year as well   Pertinent History Afibm HTN, Anemia, depression, OA, obesity, hypothyroidism   Patient  Stated Goals get back to painting, cooking, and moving around more   Currently in Pain? No/denies  knee pain with walking 4/10            [facility] PT Assessment - 05/29/15 0001    Assessment   Medical Diagnosis severe aortic stenosis   Referring Provider Dr. Arletha Grippe   Onset Date/Surgical Date 05/28/14   Hand Dominance Right   Precautions   Precautions Fall   Restrictions   Weight Bearing Restrictions No   Balance Screen   Has the patient fallen in the past 6 months Yes   How many times? 2  stumbled over roots outside and over threshold in house   Has the patient had a decrease in activity level because of a fear of falling?  No  not due to falling   Is the patient reluctant to leave their home because of a fear of falling?  No  not due to dear of falling   [place] residence   Living Arrangements Spouse/significant other   Type of [place] to enter   Entrance Stairs-Number of Steps 2   Entrance Stairs-Rails None  hold onto columns   Home Layout Two level   Alternate Level Stairs-Number of Steps 12   Alternate Level Stairs-Rails Left   Prior Function  Level of Independence Independent with household mobility without device;Independent with community mobility without device   Leisure painting at studio   Posture/Postural Control   Posture/Postural Control Postural limitations   Postural Limitations Forward head   Posture Comments in standing, bil. genu valgus with L > R   ROM / Strength   AROM / PROM / Strength AROM;Strength   Strength   Overall Strength Comments grossly 4/5 except proximal at hips and shoulders - 3/5   Strength Assessment Site Hand   Right/Left hand Right;Left   Right Hand Grip (lbs) 37   Left Hand Grip (lbs) 37   Ambulation/Gait   Gait Comments Pt ambulated without device with supervision due to mild unsteadiness especially with fatigue. Pt demo's bil. genu valgus with L > R,  bil. overpronation, Trendelenberg gait pattern and LLE circumduction in swing.    6 Minute Walk- Baseline   6 Minute Walk- Baseline yes   BP (mmHg) 124/84 mmHg   HR (bpm) 75   02 Sat (%RA) 97 %   Modified Borg Scale for Dyspnea 0- Nothing at all   Perceived Rate of Exertion (Borg) 6-   6 Minute walk- Post Test   6 Minute Walk Post Test yes   BP (mmHg) (!) 154/92 mmHg   HR (bpm) 111   02 Sat (%RA) 93 %   Modified Borg Scale for Dyspnea 3- Moderate shortness of breath or breathing difficulty  stated hard to say between 3 and 4   Perceived Rate of Exertion (Borg) 15- Hard  due to bil. knees and SOB   6 minute walk test results    Aerobic Endurance Distance Walked 555   Endurance additional comments Pt able to ambulate x 4:20 with 3-4 standing rest breaks (leaning on wall/counter) and required seated rest break ar 4:20 due to SOB and knee pain - unable to resume. Pt's BP and HR went up fairly signficantly with ambulation.          [facility] Pre-Surgical Assessment - 05/29/15 0001    5 Meter Walk Test- trial 1 5.5 sec   5 Meter Walk Test- trial 2 6 sec.    5 Meter Walk Test- trial 3 5.8 sec.  <6 sec WNL   5 meter walk test average 5.77 sec   Timed Up & Go Test trial  15.8 sec.   Comments > 12 sec indicates increased fall risk    4 Stage Balance Test tolerated for:  10 sec.   4 Stage Balance Test Position 2   comment indicates increased fall risk   Sit To Stand Test- trial 1 17.2 sec.   Comment </= 14.8 WNL for age/gender   ADL/IADL Independent with: Bathing;Dressing;Meal prep;Finances   ADL/IADL Needs Assistance with: [employer] work  doesn't need to due to townhome though   ADL/IADL Fraility Index Vulnerable                         PT Education - 05/29/15 1702    Education provided Yes   Education Details fall risk/assistive device use, shoe wear   Person(s) Educated Patient   Methods Explanation   Comprehension Verbalized understanding                     Plan - 05/29/15 1702    Clinical Impression Statement Pt is an 82 yo female presenting to OP PT for evaluation prior to possible TAVR surgery due to severe aortic stenosis. Pt reports and  overall decline in mobility over the past year due to progressive SOB with activity and overall fatigue which has limited her from housework, hobbies such as artwork  among other things. Pt presents with ROM WNL, fair strength with notable proximal weakness in hips and shoulders, fair to poor balance and at increased fall risk per 4 stage balance test and timed up and go, walking speed WNL, and fair to poor aerobic endurance per the 6 minute walk test. Pt appears to be equally limited in her mobility due to OA in knees and SOB. It is quite likely her OA is bothering her more over the recent past due to her overall decline in mobility. Pt educated on proper shoe wear and assistive decvice use to help with knee pain and safety.    PT Frequency One time visit   Consulted and Agree with Plan of Care Patient          G-Codes - 06/17/2015 1707    Functional Assessment Tool Used 6 minute walk - 555', unable to continue after 4:20   Functional Limitation Mobility: Walking and moving around   Mobility: Walking and Moving Around Current Status [phone]) At least 40 percent but less than 60 percent impaired, limited or restricted   Mobility: Walking and Moving Around Goal Status [phone]) At least 40 percent but less than 60 percent impaired, limited or restricted   Mobility: Walking and Moving Around Discharge Status [phone]) At least 40 percent but less than 60 percent impaired, limited or restricted       Problem List Patient Active Problem List   Diagnosis Date Noted  . Severe aortic stenosis 06/24/2013  . Foot pain, bilateral 11/05/2011  . Morton neuroma 11/05/2011  . Repeated falls 11/05/2011  . CLOSED ANTERIOR DISLOCATION OF HUMERUS 03/26/2010  . SUBACROMIAL BURSITIS, RIGHT 08/14/2009   . KNEE PAIN, RIGHT 06/05/2009  . BICEPS TENDON RUPTURE, LEFT 03/02/2009  . ARM PAIN, LEFT 03/02/2009  . SHOULDER PAIN, LEFT 09/16/2008  . ANEMIA NOS 02/26/2007  . MYALGIA 02/19/2007  . HYPOTHYROIDISM NOS 07/17/2006  . OBESITY NOS 07/17/2006  . Chronic atrial fibrillation ([place]) 07/17/2006  . FATIGUE, CHRONIC 07/17/2006  . HYPERLIPIDEMIA 07/03/2006  . DEPRESSION, MAJOR, RECURRENT 07/03/2006  . Essential hypertension, benign 07/03/2006  . ROSACEA 07/03/2006  . Candiss Norse SITES 07/03/2006    Romualdo Bolk, PT 2015/06/17, 5:09 PM  [facility] [address] [place], [place], [address] Phone: [phone]   Fax:  [phone]  Name: Tara Mack MRN: [id number] Date of Birth: [birth year]

## 2015-05-30 ENCOUNTER — Institutional Professional Consult (permissible substitution) (INDEPENDENT_AMBULATORY_CARE_PROVIDER_SITE_OTHER): Payer: Medicare Other | Admitting: Thoracic Surgery (Cardiothoracic Vascular Surgery)

## 2015-05-30 ENCOUNTER — Encounter (HOSPITAL_COMMUNITY): Payer: Medicare Other

## 2015-05-30 ENCOUNTER — Encounter: Payer: Self-pay | Admitting: Thoracic Surgery (Cardiothoracic Vascular Surgery)

## 2015-05-30 VITALS — BP 158/103 | HR 103 | Resp 16 | Ht 62.0 in | Wt 165.0 lb

## 2015-05-30 DIAGNOSIS — I482 Chronic atrial fibrillation, unspecified: Secondary | ICD-10-CM

## 2015-05-30 DIAGNOSIS — R0602 Shortness of breath: Secondary | ICD-10-CM | POA: Diagnosis not present

## 2015-05-30 DIAGNOSIS — I350 Nonrheumatic aortic (valve) stenosis: Secondary | ICD-10-CM

## 2015-05-30 DIAGNOSIS — I5032 Chronic diastolic (congestive) heart failure: Secondary | ICD-10-CM | POA: Insufficient documentation

## 2015-05-30 NOTE — Patient Instructions (Signed)
Patient has been instructed to stop taking Eliquis 5 days prior to surgery  Patient should continue taking all other medications without change through the day before surgery.  Patient should have nothing to eat or drink after midnight the night before surgery.  On the morning of surgery patient should take only Synthroid with a sip of water.

## 2015-05-30 NOTE — Progress Notes (Signed)
[facility]  MULTIDISCIPLINARY HEART VALVE CLINIC    CARDIOTHORACIC SURGERY CONSULTATION REPORT  Referring Provider is Crenshaw, Denice Bors, MD PCP is Mathews Argyle, MD  Chief Complaint  Patient presents with  . Aortic Stenosis    2nd opinion    HPI:  Patient is an 82 year old female with history of aortic stenosis, chronic persistent atrial fibrillation, hypertension, hypothyroidism, depression, and chronic fatigue who has been referred for a second surgical opinion to discuss treatment options for management of severe symptomatic aortic stenosis.  The patient's cardiac history dates back many years ago when she developed paroxysmal atrial fibrillation. This initially occurred in the setting of hyperthyroidism caused by excessively high doses of thyroid replacement therapy for hypothyroidism. Her atrial fibrillation resolved. Approximately 2 years ago the patient was noted to be in atrial fibrillation by her primary care physician. She was referred to Dr. Stanford Breed who has been following her regularly ever since.  An echocardiogram performed in January 2015 revealed normal left ventricular systolic function with moderate to severe left atrial enlargement and mild aortic stenosis with mean transvalvular gradient estimated 18 mmHg.  The patient's aortic stenosis has been followed with serial echocardiograms. Echocardiogram performed in August 2016 revealed normal left ventricular systolic function with severe aortic stenosis and mean transvalvular gradient across the aortic valve estimated 43 mmHg.  Over the past year or so the patient has complained of worsening symptoms of fatigue and exertional shortness of breath.  Repeat echocardiogram performed 04/21/2015 revealed further progression of the severity aortic stenosis with peak velocity across the aortic valve measured as high as 4.5 m per sec and corresponding to mean transvalvular gradient estimated 54 mmHg. Left ventricular  systolic function remains normal with ejection fraction estimated 55%. The patient was referred to Dr. Burt Knack and underwent diagnostic cardiac catheterization on 05/15/2015. She was found to have severe aortic stenosis with Peak to peak and mean transvalvular gradients measured 37 and 25 mmHg respectively. Aortic valve area was calculated 0.8 cm. The patient was found to have mild nonobstructive coronary artery disease And moderate pulmonary hypertension. The patient was referred for surgical consultation and evaluated recently by Dr. Cyndia Bent. She was felt to be of at least moderate risk for conventional surgical aortic valve replacement and transcatheter aortic valve replacement was discussed as a less invasive alternative. The patient has undergone CT angiography to characterize the anatomical feasibility of transcatheter aortic valve replacement and presents to our office today for a second surgical opinion.  The patient is married and lives locally in [place] with her husband who is a retired Lexicographer and previously ran the [place] at [facility].  Patient has been moderately obese for most of her adult life. However, she states that she had remained physically active until the last several years. She has developed progressive degenerative arthritis that primarily affects her knees and has limited her mobility considerably. She has a long-standing history of complaints of chronic fatigue. She has developed progressive symptoms of exertional shortness of breath over the past year. She does not get short of breath with ordinary activities around the house, but she gets short of breath easily with any more strenuous activity or simply trying to walk at a brisk pace. Her arthritis affects her balance and limits her mobility considerably, but she gets tired quite easily. She denies any history of resting shortness of breath, PND, orthopnea, or syncope. She has occasional dizzy spells and mild  bilateral lower extremity edema. She has never had any  chest pain or chest tightness either with activity or at rest.  Past Medical History  Diagnosis Date  . FIBRILLATION, ATRIAL   . HYPERLIPIDEMIA   . HYPERTENSION, BENIGN SYSTEMIC   . ANEMIA NOS   . BICEPS TENDON RUPTURE, LEFT   . Closed anterior dislocation of humerus   . DEPRESSION, MAJOR, RECURRENT   . FATIGUE, CHRONIC   . HYPOTHYROIDISM NOS   . OBESITY NOS   . OSTEOARTHRITIS, MULTI SITES   . Rosacea   . SUBACROMIAL BURSITIS, RIGHT   . Breast cancer ([place])   . Chronic diastolic (congestive) heart failure ([place])   . Chronic atrial fibrillation ([place]) 07/17/2006    Qualifier: Diagnosis of  By: Oneida Alar MD, KARL    . Severe aortic stenosis 06/24/2013    43 mmHg mean gradient Aug 2016     Past Surgical History  Procedure Laterality Date  . Mastectomy Right   . Tonsillectomy    . Abdominal hysterectomy    . Cardiac catheterization N/A 05/15/2015    Procedure: Right/Left Heart Cath and Coronary Angiography;  Surgeon: Sherren Mocha, MD;  Location: [facility];  Service: Cardiovascular;  Laterality: N/A;    Family History  Problem Relation Age of Onset  . CAD Mother     Social History   Social History  . Marital Status: Married    Spouse Name: N/A  . Number of Children: 2  . Years of Education: N/A   Occupational History  .     Social History Main Topics  . Smoking status: Never Smoker   . Smokeless tobacco: Not on file  . Alcohol Use: Yes     Comment: 3 drinks per night  . Drug Use: Not on file  . Sexual Activity: Not on file   Other Topics Concern  . Not on file   Social History Narrative    Current Outpatient Prescriptions  Medication Sig Dispense Refill  . acetaminophen (TYLENOL) 500 MG tablet Take 1,000 mg by mouth 2 (two) times daily as needed for mild pain or headache.    . diltiazem (CARDIZEM CD) 180 MG 24 hr capsule Take 1 capsule (180 mg total) by mouth daily. 30 capsule 12  . ELIQUIS 5 MG  TABS tablet Take 5 mg by mouth 2 (two) times daily.     . fluticasone (FLONASE) 50 MCG/ACT nasal spray Place 2 sprays into both nostrils daily. Reported on 05/29/2015    . furosemide (LASIX) 20 MG tablet Take 20 mg by mouth daily.     Marland Kitchen guaiFENesin-dextromethorphan (ROBITUSSIN DM) 100-10 MG/5ML syrup Take 5 mLs by mouth every 4 (four) hours as needed for cough.    . levothyroxine (SYNTHROID, LEVOTHROID) 50 MCG tablet Take 50 mcg by mouth daily. 75 MCG ON mwf AND 50 MCG ALL OTHERS    . losartan (COZAAR) 50 MG tablet Take 1 tablet (50 mg total) by mouth daily. 90 tablet 3  . Omega-3 Fatty Acids (FISH OIL) 1000 MG CAPS Take 1,000 mg by mouth 2 (two) times daily.    Marland Kitchen SYNTHROID 75 MCG tablet Take 1 tablet by mouth. 75mg  Monday Wednesday Friday 50mg  Tuesday Thursday Saturday Sunday    . tretinoin (RETIN-A) 0.05 % cream Apply 1 application topically daily as needed (for skin). Apply a small amount to skin daily    . venlafaxine XR (EFFEXOR-XR) 37.5 MG 24 hr capsule Take 37.5 mg by mouth daily.      No current facility-administered medications for this visit.    No  Known Allergies    Review of Systems:   General:  normal appetite, decreased energy, no weight gain, no weight loss, no fever  Cardiac:  no chest pain with exertion, no chest pain at rest, + SOB with exertion, no resting SOB, no PND, no orthopnea, no palpitations, + arrhythmia, + atrial fibrillation, + LE edema, + dizzy spells, no syncope  Respiratory:  + exertional shortness of breath, no home oxygen, no productive cough, no dry cough, no bronchitis, no wheezing, no hemoptysis, no asthma, no pain with inspiration or cough, no sleep apnea, no CPAP at night  GI:   no difficulty swallowing, no reflux, no frequent heartburn, no hiatal hernia, no abdominal pain, + chronic constipation, no diarrhea, no hematochezia, no hematemesis, no melena  GU:   no dysuria,  no frequency, no urinary tract infection, no hematuria, no kidney stones, no kidney  disease  Vascular:  no pain suggestive of claudication, no pain in feet, no leg cramps, no varicose veins, no DVT, no non-healing foot ulcer  Neuro:   no stroke, no TIA's, no seizures, no headaches, no temporary blindness one eye,  no slurred speech, no peripheral neuropathy, + chronic pain, + mild instability of gait, no memory/cognitive dysfunction  Musculoskeletal: + arthritis, + joint swelling, no myalgias, + difficulty walking, limited mobility   Skin:   no rash, no itching, no skin infections, no pressure sores or ulcerations  Psych:   no anxiety, + depression, no nervousness, no unusual recent stress  Eyes:   no blurry vision, no floaters, no recent vision changes, + wears glasses or contacts  ENT:   no hearing loss, no loose or painful teeth, no dentures, last saw dentist within the past month  Hematologic:  + easy bruising, no abnormal bleeding, no clotting disorder, no frequent epistaxis  Endocrine:  no diabetes, does not check CBG's at home           Physical Exam:   BP 158/103 mmHg  Pulse 103  Resp 16  Ht 5\' 2"  (1.575 m)  Wt 165 lb (74.844 kg)  BMI 30.17 kg/m2  SpO2 92%  General:  Obese, o/w  well-appearing  HEENT:  Unremarkable   Neck:   no JVD, no bruits, no adenopathy   Chest:   clear to auscultation, symmetrical breath sounds, no wheezes, no rhonchi   CV:   Irregular rate and rhythm, grade III/VI crescendo/decrescendo murmur heard best at RSB,  no diastolic murmur  Abdomen:  soft, non-tender, no masses   Extremities:  warm, well-perfused, pulses diminished, trace bilateral LE edema  Rectal/GU  Deferred  Neuro:   Grossly non-focal and symmetrical throughout  Skin:   Clean and dry, no rashes, no breakdown   Diagnostic Tests:  Transthoracic Echocardiography  Patient:  Anailah, Mokry MR #:    [id number] Study Date: 04/14/2015 Gender:   F Age:    68 Height:   157.5 cm Weight:   81.6 kg BSA:    1.92 m^2 Pt. Status: Room:  SONOGRAPHER  Charlann Noss, RDCS ATTENDING  Dorris Carnes, M.D. ORDERING   Kilroy, Luke K REFERRING  Lupton, Luke K PERFORMING  [facility], Outpatient  cc:  ------------------------------------------------------------------- LV EF: 55% -  60%  ------------------------------------------------------------------- Indications:   Aortic stenosis 424.1.  ------------------------------------------------------------------- History:  PMH: Acquired from the patient and from the patient&'s chart. Fatigue, dyspnea, and bilateral lower extremity edema. Atrial fibrillation. Aortic valve disease. Risk factors: Hypertension. Obese.  ------------------------------------------------------------------- Study Conclusions  - Left ventricle: The cavity size was  normal. Wall thickness was increased in a pattern of mild LVH. Systolic function was normal. The estimated ejection fraction was in the range of 55% to 60%. Wall motion was normal; there were no regional wall motion abnormalities. Doppler parameters are consistent with high ventricular filling pressure. - Aortic valve: Valve mobility was severely restricted. There was severe stenosis. There was trivial regurgitation. - Mitral valve: Calcified annulus. There was moderate regurgitation. - Left atrium: The atrium was severely dilated. - Right atrium: The atrium was moderately dilated. - Pulmonary arteries: Systolic pressure was moderately increased. - Pericardium, extracardiac: A trivial pericardial effusion was identified.  Impressions:  - Normal LV systolic function; mild LVH; elevated LV filling pressure; biatrial enlargement; thickened aortic valve with severe AS (mean gradient 54 mmHg) and trace AI; moderate MR; mild TR; moderately elevated pulmonary pressure.  ------------------------------------------------------------------- Labs, prior tests, procedures, and surgery: Echocardiography (August  2016).  Transthoracic echocardiography. M-mode, complete 2D, spectral Doppler, and color Doppler. Birthdate: Patient birthdate: [birth year]. Age: Patient is 82 yr old. Sex: Gender: female. BMI: 32.9 kg/m^2. Blood pressure:   132/84 Patient status: Outpatient. Study date: Study date: 04/14/2015. Study time: 11:52 AM. Location: [facility]  -------------------------------------------------------------------  ------------------------------------------------------------------- Left ventricle: The cavity size was normal. Wall thickness was increased in a pattern of mild LVH. Systolic function was normal. The estimated ejection fraction was in the range of 55% to 60%. Wall motion was normal; there were no regional wall motion abnormalities. The study was not technically sufficient to allow evaluation of LV diastolic dysfunction due to atrial fibrillation. Doppler parameters are consistent with high ventricular filling pressure.  ------------------------------------------------------------------- Aortic valve:  Trileaflet; mildly thickened leaflets. Valve mobility was severely restricted. Doppler:  There was severe stenosis.  There was trivial regurgitation.  VTI ratio of LVOT to aortic valve: 0.18. Indexed valve area (VTI): 0.29 cm^2/m^2. Mean velocity ratio of LVOT to aortic valve: 0.1. Indexed valve area (Vmean): 0.17 cm^2/m^2.  Mean gradient (S): 54 mm Hg.  ------------------------------------------------------------------- Aorta: Aortic root: The aortic root was normal in size.  ------------------------------------------------------------------- Mitral valve:  Calcified annulus. Mobility was not restricted. Doppler: Transvalvular velocity was within the normal range. There was no evidence for stenosis. There was moderate regurgitation. Peak gradient (D): 11 mm Hg.  ------------------------------------------------------------------- Left atrium:  The atrium was severely dilated.  ------------------------------------------------------------------- Right ventricle: The cavity size was normal. Systolic function was normal.  ------------------------------------------------------------------- Pulmonic valve:  Doppler: Transvalvular velocity was within the normal range. There was no evidence for stenosis. There was trivial regurgitation.  ------------------------------------------------------------------- Tricuspid valve:  Structurally normal valve.  Doppler: Transvalvular velocity was within the normal range. There was mild regurgitation.  ------------------------------------------------------------------- Pulmonary artery:  Systolic pressure was moderately increased.  ------------------------------------------------------------------- Right atrium: The atrium was moderately dilated.  ------------------------------------------------------------------- Pericardium: A trivial pericardial effusion was identified.  ------------------------------------------------------------------- Systemic veins: Inferior vena cava: The vessel was normal in size.  ------------------------------------------------------------------- Measurements  Left ventricle             Value       Reference LV ID, ED, PLAX chordal    (L)   38.1 mm     43 - 52 LV ID, ES, PLAX chordal        25.7 mm     23 - 38 LV fx shortening, PLAX chordal     33  %      >=29 LV PW thickness, ED          11.9 mm     --------- IVS/LV  PW ratio, ED          0.85       <=1.3 Stroke volume, 2D           46  ml     --------- Stroke volume/bsa, 2D         24  ml/m^2   --------- LV e&', lateral             9.76 cm/s    --------- LV E/e&', lateral            17.32       ---------  Ventricular septum            Value       Reference IVS thickness, ED           10.1 mm     ---------  LVOT                  Value       Reference LVOT ID, S               20  mm     --------- LVOT area               3.14 cm^2    --------- LVOT ID                20  mm     --------- LVOT peak velocity, S         69.2 cm/s    --------- LVOT mean velocity, S         46.6 cm/s    --------- LVOT VTI, S              14.6 cm     --------- Stroke volume (SV), LVOT DP      45.9 ml     --------- Cardiac output (Qs), LVOT DP      4.1  L/min    --------- Cardiac index (Qs/bsa), LVOT      2.1  L/(min-m^2) --------- DP Stroke index (SV/bsa), LVOT DP     23.8 ml/m^2   ---------  Aortic valve              Value       Reference Aortic valve mean velocity, S     454  cm/s    --------- Aortic valve VTI, S          82.6 cm     --------- Aortic mean gradient, S        54  mm Hg    --------- VTI ratio, LVOT/AV           0.18       --------- Aortic valve area/bsa, VTI       0.29 cm^2/m^2  --------- Velocity ratio, mean, LVOT/AV     0.1        --------- Aortic valve area/bsa, mean      0.17 cm^2/m^2  --------- velocity  Aorta                 Value       Reference Aortic root ID, ED           32  mm     ---------  Left atrium              Value       Reference LA ID, A-P, ES             42  mm     ---------  LA ID/bsa, A-P             2.18 cm/m^2   <=2.2 LA volume, S              148  ml     --------- LA volume/bsa, S            76.9 ml/m^2   --------- LA volume, ES, 1-p  A4C         107  ml     --------- LA volume/bsa, ES, 1-p A4C       55.6 ml/m^2   --------- LA volume, ES, 1-p A2C         178  ml     --------- LA volume/bsa, ES, 1-p A2C       92.5 ml/m^2   ---------  Mitral valve              Value       Reference Mitral E-wave peak velocity      169  cm/s    --------- Mitral A-wave peak velocity      54.2 cm/s    --------- Mitral deceleration time        162  ms     150 - 230 Mitral peak gradient, D        11  mm Hg    --------- Mitral E/A ratio, peak         3.1        ---------  Tricuspid valve            Value       Reference Tricuspid regurg peak velocity     320  cm/s    --------- Tricuspid peak RV-RA gradient     41  mm Hg    ---------  Right ventricle            Value       Reference RV s&', lateral, S           7.46 cm/s    ---------  Legend: (L) and (H) mark values outside specified reference range.  ------------------------------------------------------------------- Prepared and Electronically Authenticated by  Kirk Ruths 2016-12-16T10:05:51    CARDIAC CATHETERIZATION  Procedures    Right/Left Heart Cath and Coronary Angiography    Conclusion    1. Severe aortic stenosis  2. Mild nonobstructive CAD 3. Normal LV function 4. Moderate pulmonary HTN  Cardiac surgical evaluation for TAVR versus conventional AVR    Indications    Severe aortic stenosis [I35.0 (ICD-10-CM)]    Technique and Indications    INDICATION: Severe aortic stenosis  PROCEDURAL DETAILS: There was an indwelling IV in a right antecubital vein. Using normal sterile technique, the IV was changed out for a 5 Fr brachial sheath over a 0.018 inch wire. The right wrist was then prepped, draped, and anesthetized with 1% lidocaine. Using  the modified Seldinger technique a 5/6 French Slender sheath was placed in the right radial artery. Intra-arterial verapamil was administered through the radial artery sheath. IV heparin was administered after a JR4 catheter was advanced into the central aorta. A Swan-Ganz catheter was used for the right heart catheterization. Standard protocol was followed for recording of right heart pressures and sampling of oxygen saturations. Fick cardiac output was calculated. Standard Judkins catheters were used for selective coronary angiography, aortic root angiography, and left ventriculography. There were no immediate procedural complications. The patient was transferred to the post catheterization recovery area for further monitoring.    Estimated blood loss <  50 mL. There were no immediate complications during the procedure.    Coronary Findings    Dominance: Right   Left Main   . Ost LM to LM lesion, 25% stenosed. Calcified. The left main is mildly calcified. The distal vessel has 20-25% stenosis without significant obstruction     Left Anterior Descending   . Prox LAD lesion, 25% stenosed. Calcified. The LAD is mildly calcified. The vessel reaches the LV apex. There is a large first diagonal branch with no significant stenosis. There is mild irregularity of the LAD but no significant stenosis is present.     Left Circumflex  The left circumflex distribution is small and there is no significant stenosis present.     Right Coronary Artery  The RCA is a large, dominant vessel with mild calcification and no significant obstruction. The vessel supplies a large PDA and PLA branch.       Right Heart Pressures Hemodynamic findings consistent with aortic stenosis. Elevated LV EDP consistent with volume overload.    Wall Motion                 Left Heart    Aortic Valve , and no aortic valve regurgitation. The aortic valve is calcified. There is restricted aortic valve motion.  Peak-to-Peak 37 mmHg, mean 25 mmHg, AVA 0.8 square cm    Coronary Diagrams    Diagnostic Diagram            Implants    Name ID Temporary Type Supply   No information to display    PACS Images    Show images for Cardiac catheterization     Link to Procedure Log    Procedure Log      Hemo Data       Most Recent Value   Fick Cardiac Output  3.94 L/min   Fick Cardiac Output Index  2.2 (L/min)/BSA   Aortic Mean Gradient  25.2 mmHg   Aortic Peak Gradient  37 mmHg   Aortic Valve Area  0.81   Aortic Value Area Index  0.45 cm2/BSA   RA A Wave  -99 mmHg   RA V Wave  16 mmHg   RA Mean  13 mmHg   RV Systolic Pressure  63 mmHg   RV Diastolic Pressure  2 mmHg   RV EDP  9 mmHg   PA Systolic Pressure  63 mmHg   PA Diastolic Pressure  26 mmHg   PA Mean  39 mmHg   PW A Wave  -99 mmHg   PW V Wave  22 mmHg   PW Mean  22 mmHg   AO Systolic Pressure  [id number] mmHg   AO Diastolic Pressure  78 mmHg   AO Mean  123XX123 mmHg   LV Systolic Pressure  0000000 mmHg   LV Diastolic Pressure  10 mmHg   LV EDP  16 mmHg   Arterial Occlusion Pressure Extended Systolic Pressure  [id number] mmHg   Arterial Occlusion Pressure Extended Diastolic Pressure  83 mmHg   Arterial Occlusion Pressure Extended Mean Pressure  105 mmHg   Left Ventricular Apex Extended Systolic Pressure  123XX123 mmHg   Left Ventricular Apex Extended Diastolic Pressure  13 mmHg   Left Ventricular Apex Extended EDP Pressure  19 mmHg   QP/QS  1   TPVR Index  17.74 HRUI   TSVR Index  45.48 HRUI   PVR SVR Ratio  0.2      Cardiac TAVR CT TECHNIQUE: The patient was scanned  on a Philips 256 scanner. A 120 kV retrospective scan was triggered in the descending thoracic aorta at 111 HU's. Gantry rotation speed was 270 msecs and collimation was .9 mm. No beta blockade or nitro were given. The 3D data set was reconstructed in 5% intervals of the R-R cycle. Systolic and diastolic phases were analyzed on a  dedicated work station using MPR, MIP and VRT modes. The patient received 80 cc of contrast. FINDINGS: Aortic Valve: Tri-leaflet and heavily calcified. Moderate calcification of the STJ Aorta: Mild calcification of the arch and descending thoracic aorta. Bovine Arch Sinotubular Junction: 27 mm Ascending Thoracic Aorta: 37 mm Aortic Arch: 25 mm Sinus of Valsalva Measurements: Non-coronary: 28 mm Right -coronary: 28 mm Left -coronary: 27 mm Coronary Artery Height above Annulus: Left Main: 13.4 mm Right Coronary: 13.8 mm Virtual Basal Annulus Measurements: Maximum/Minimum Diameter: 25 mm x 20.6 mm Perimeter: 72 mm Area: 400 mm2 Coronary Arteries: Sufficient height above annulus for deployment Optimum Fluoroscopic Angle for Delivery: Unable to determine but appears to required steep caudal angulation in AP view IMPRESSION: 1) Calcified tri-leaflet aortic valve with annulus 400 mm2 suitable for a 23 mm Sapien 3 valve 2) Suitable coronary height above annulus for deployment 3) Bovine Arch with no contraindications to femoral or thoracic delivery 4) No LAA thrombus 5) Angiographic angle for delivery difficult to determine but would appear to need significant caudle angulation in AP view Jenkins Rouge Electronically Signed  By: Jenkins Rouge M.D.  On: 05/29/2015 13:59   CT ANGIOGRAPHY CHEST, ABDOMEN AND PELVIS  TECHNIQUE: Multidetector CT imaging through the chest, abdomen and pelvis was performed using the standard protocol during bolus administration of intravenous contrast. Multiplanar reconstructed images and MIPs were obtained and reviewed to evaluate the vascular anatomy.  CONTRAST: 29mL OMNIPAQUE IOHEXOL 350 MG/ML SOLN  COMPARISON: No priors.  FINDINGS: CTA CHEST FINDINGS  Mediastinum/Lymph Nodes: Heart size is mildly enlarged. There is no significant pericardial fluid, thickening or pericardial calcification. Atherosclerotic calcifications  in the left anterior descending and right coronary arteries. Severe calcifications of the aortic valve. Dilatation of the main pulmonary artery (3.5 cm in diameter), suggesting underlying pulmonary arterial hypertension. No lymphadenopathy noted in the mediastinal or hilar nodal stations. Esophagus is normal in appearance. No axillary lymphadenopathy.  Lungs/Pleura: Mild diffuse interlobular septal thickening with patchy areas of ground-glass attenuation throughout the lungs bilaterally, suggesting a background of mild interstitial pulmonary edema. Mild pleural thickening and/or trace pleural effusion in the left hemithorax.  Musculoskeletal/Soft Tissues: Status post right modified radical mastectomy. There are no aggressive appearing lytic or blastic lesions noted in the visualized portions of the skeleton.  CTA ABDOMEN AND PELVIS FINDINGS  Hepatobiliary: No cystic or solid hepatic lesions. No intra or extrahepatic biliary ductal dilatation. Normal appearance of the gallbladder.  Pancreas: No pancreatic mass. No pancreatic ductal dilatation. No pancreatic or peripancreatic fluid or inflammatory changes.  Spleen: Unremarkable.  Adrenals/Urinary Tract: Mild bilateral renal atrophy. Several sub cm low-attenuation lesions in the kidneys bilaterally, too small to characterize, but favored to represent tiny cysts. No hydroureteronephrosis. Urinary bladder is unremarkable in appearance. Bilateral adrenal glands are normal in appearance.  Stomach/Bowel: Normal appearance of the stomach. No pathologic dilatation of small bowel or colon. Several colonic diverticulae are noted, most evident in the sigmoid colon, without surrounding inflammatory changes to suggest an acute diverticulitis at this time. Normal appendix.  Vascular/Lymphatic: Extensive atherosclerosis throughout the abdominal and pelvic vasculature, with vascular findings and measurements pertinent to potential  TAVR, as discussed below.  There is fusiform ectasia of the infrarenal abdominal aorta which measures up to 31 x 24 mm (mean diameter of 27 mm). Three right-sided renal arteries. Single left renal artery. Celiac axis, superior mesenteric artery and inferior mesenteric artery are all widely patent. No lymphadenopathy noted in the abdomen or pelvis.  Reproductive: Status post hysterectomy. Ovaries are not confidently identified may be surgically absent or atrophic.  Other: No significant volume of ascites. No pneumoperitoneum.  Musculoskeletal: There are no aggressive appearing lytic or blastic lesions noted in the visualized portions of the skeleton.  VASCULAR MEASUREMENTS PERTINENT TO TAVR:  AORTA:  Minimal Aortic Diameter - 15 x 16 mm  Severity of Aortic Calcification - moderate to severe  RIGHT PELVIS:  Right Common Iliac Artery -  Minimal Diameter - 9.1 x 8.2 mm  Tortuosity - moderate  Calcification - mild  Right External Iliac Artery -  Minimal Diameter - 6.2 x 6.4 mm  Tortuosity - severe  Calcification - none  Right Common Femoral Artery -  Minimal Diameter - 6.6 x 5.5 mm  Tortuosity - moderate  Calcification - mild  LEFT PELVIS:  Left Common Iliac Artery -  Minimal Diameter - 8.1 x 6.6 mm  Tortuosity - severe  Calcification - moderate  Left External Iliac Artery -  Minimal Diameter - 6.5 x 6.9 mm  Tortuosity - severe  Calcification - none  Left Common Femoral Artery -  Minimal Diameter - 7.1 x 6.9 mm  Tortuosity - mild  Calcification - mild  Review of the MIP images confirms the above findings.  IMPRESSION: 1. Vascular findings and measurements pertinent to potential TAVR procedure, as detailed above. This patient does appear to have suitable pelvic arterial access bilaterally, however, due to the small caliber right common femoral artery, access may be more optimal on the left side. 2.  Severe calcification of the aortic valve, compatible with the reported clinical history of severe aortic stenosis. 3. Dilatation of the main pulmonary artery, suggestive of pulmonary arterial hypertension. 4. Additional incidental findings, as above.   Electronically Signed  By: Vinnie Langton M.D.  On: 05/29/2015 13:39   STS Risk Calculator  Procedure    AVR  Risk of Mortality   2.8% Morbidity or Mortality  15.2% Prolonged LOS   6.5% Short LOS    30.9% Permanent Stroke   1.8% Prolonged Vent Support  10.1% DSW Infection    0.2% Renal Failure    3.3% Reoperation    6.6%   Impression:  Patient has stage D severe symptomatic aortic stenosis. She presents with progressive symptoms of exertional shortness of breath and fatigue consistent with chronic diastolic congestive heart failure, [place] Heart Association functional class IIB. I have personally reviewed the patient's recent transthoracic echocardiogram and diagnostic cardiac catheterization. Echocardiogram confirms the presence of severe thickening, calcification, and restricted leaflet mobility involving all 3 leaflets of the patient's aortic valve. Peak velocity across the aortic valve measures in excess of 4.5 m/s despite being somewhat variable because of the presence of underlying atrial fibrillation. Mean transvalvular gradient is estimated greater than 50 mmHg. Left ventricular systolic function remains preserved with ejection fraction estimated 55%. Diagnostic cardiac catheterization is notable for the absence of significant coronary artery disease but does reveal the presence of moderate pulmonary hypertension. Risks associated with conventional surgical aortic valve replacement would be at least moderately elevated because of the patient's age and comorbid medical problems. I feel that risks associated with conventional surgery might be somewhat higher because of the  patient's limited physical mobility.  Transcatheter  aortic valve replacement might prove to be a far less invasive and potentially less risky alternative to conventional surgery.  Cardiac gated CT angiogram of the heart demonstrates anatomical characteristics suitable for transcatheter aortic valve replacement without any significant complicating features and CT angiogram of the abdomen and pelvis demonstrates the presence of moderate atherosclerotic disease with what appears to be adequate pelvic vascular access to facilitate a transfemoral approach for transcatheter aortic valve replacement.   Plan:  The patient and her husband were counseled at length regarding treatment alternatives for management of severe symptomatic aortic stenosis. Alternative approaches such as conventional aortic valve replacement, transcatheter aortic valve replacement, and palliative medical therapy were compared and contrasted at length.  The risks associated with conventional surgical aortic valve replacement were been discussed in detail, as were expectations for post-operative convalescence. Long-term prognosis with medical therapy was discussed. This discussion was placed in the context of the patient's own specific clinical presentation and past medical history.  All of their questions been addressed.  The patient hopes to proceed with transcatheter aortic valve replacement as an alternative to conventional surgery in the near future.  Following the decision to proceed with transcatheter aortic valve replacement, a discussion has been held regarding what types of management strategies would be attempted intraoperatively in the event of life-threatening complications, including whether or not the patient would be considered a candidate for the use of cardiopulmonary bypass and/or conversion to open sternotomy for attempted surgical intervention.  The patient has been advised of a variety of complications that might develop including but not limited to risks of death, stroke,  paravalvular leak, aortic dissection or other major vascular complications, aortic annulus rupture, device embolization, cardiac rupture or perforation, mitral regurgitation, acute myocardial infarction, arrhythmia, heart block or bradycardia requiring permanent pacemaker placement, congestive heart failure, respiratory failure, renal failure, pneumonia, infection, other late complications related to structural valve deterioration or migration, or other complications that might ultimately cause a temporary or permanent loss of functional independence or other long term morbidity.  The patient provides full informed consent for the procedure as described and all questions were answered.  We tentatively plan to proceed with whatever on Tuesday, 06/27/2015. The patient has been instructed to stop taking Eliquis 5 days prior to surgery.    I spent in excess of 90 minutes during the conduct of this office consultation and >50% of this time involved direct face-to-face encounter with the patient for counseling and/or coordination of their care.    Valentina Gu. Roxy Manns, MD 05/30/2015 6:01 PM

## 2015-05-31 ENCOUNTER — Other Ambulatory Visit: Payer: Self-pay | Admitting: *Deleted

## 2015-05-31 DIAGNOSIS — I350 Nonrheumatic aortic (valve) stenosis: Secondary | ICD-10-CM

## 2015-06-02 ENCOUNTER — Ambulatory Visit (HOSPITAL_COMMUNITY)
Admission: RE | Admit: 2015-06-02 | Discharge: 2015-06-02 | Disposition: A | Discharge status: Home / Self Care | Payer: Medicare Other | Source: Ambulatory Visit | Attending: Surgery | Admitting: Surgery

## 2015-06-02 DIAGNOSIS — I350 Nonrheumatic aortic (valve) stenosis: Secondary | ICD-10-CM | POA: Insufficient documentation

## 2015-06-02 LAB — PULMONARY FUNCTION TEST
DL/VA % PRED: 89 %
DL/VA: 4.04 ml/min/mmHg/L
DLCO unc % pred: 27 %
DLCO unc: 5.93 ml/min/mmHg
FEF 25-75 POST: 0.52 L/s
FEF 25-75 Pre: 1.03 L/sec
FEF2575-%CHANGE-POST: -49 %
FEF2575-%PRED-PRE: 86 %
FEF2575-%Pred-Post: 43 %
FEV1-%Change-Post: -25 %
FEV1-%Pred-Post: 56 %
FEV1-%Pred-Pre: 76 %
FEV1-PRE: 1.28 L
FEV1-Post: 0.96 L
FEV1FVC-%CHANGE-POST: -18 %
FEV1FVC-%Pred-Pre: 104 %
FEV6-%CHANGE-POST: -8 %
FEV6-%Pred-Post: 71 %
FEV6-%Pred-Pre: 78 %
FEV6-PRE: 1.67 L
FEV6-Post: 1.54 L
FEV6FVC-%PRED-PRE: 106 %
FEV6FVC-%Pred-Post: 106 %
FVC-%CHANGE-POST: -8 %
FVC-%PRED-PRE: 73 %
FVC-%Pred-Post: 67 %
FVC-POST: 1.54 L
FVC-PRE: 1.67 L
POST FEV1/FVC RATIO: 62 %
Post FEV6/FVC ratio: 100 %
Pre FEV1/FVC ratio: 77 %
Pre FEV6/FVC Ratio: 100 %
RV % pred: 73 %
RV: 1.72 L
TLC % pred: 67 %
TLC: 3.23 L

## 2015-06-02 MED ORDER — ALBUTEROL SULFATE (2.5 MG/3ML) 0.083% IN NEBU
2.5000 mg | INHALATION_SOLUTION | Freq: Once | RESPIRATORY_TRACT | Status: AC
  Administered 2015-06-02: 2.5 mg via RESPIRATORY_TRACT

## 2015-06-07 ENCOUNTER — Other Ambulatory Visit: Payer: Self-pay | Admitting: *Deleted

## 2015-06-07 DIAGNOSIS — I350 Nonrheumatic aortic (valve) stenosis: Secondary | ICD-10-CM

## 2015-06-15 ENCOUNTER — Other Ambulatory Visit: Payer: Self-pay | Admitting: Cardiology

## 2015-06-15 NOTE — Telephone Encounter (Signed)
Rx refill sent to pharmacy. 

## 2015-06-23 ENCOUNTER — Encounter (HOSPITAL_COMMUNITY): Payer: Self-pay

## 2015-06-23 ENCOUNTER — Ambulatory Visit (HOSPITAL_COMMUNITY)
Admission: RE | Admit: 2015-06-23 | Discharge: 2015-06-23 | Disposition: A | Discharge status: Home / Self Care | Payer: Medicare Other | Source: Ambulatory Visit | Attending: Cardiovascular Disease | Admitting: Cardiovascular Disease

## 2015-06-23 ENCOUNTER — Encounter (HOSPITAL_COMMUNITY)
Admission: RE | Admit: 2015-06-23 | Discharge: 2015-06-23 | Disposition: A | Discharge status: Home / Self Care | Payer: Medicare Other | Source: Ambulatory Visit | Attending: Cardiovascular Disease | Admitting: Cardiovascular Disease

## 2015-06-23 VITALS — BP 129/83 | HR 114 | Temp 97.7°F | Resp 20 | Ht 62.0 in | Wt 175.6 lb

## 2015-06-23 DIAGNOSIS — I4891 Unspecified atrial fibrillation: Secondary | ICD-10-CM | POA: Insufficient documentation

## 2015-06-23 DIAGNOSIS — I509 Heart failure, unspecified: Secondary | ICD-10-CM | POA: Insufficient documentation

## 2015-06-23 DIAGNOSIS — I350 Nonrheumatic aortic (valve) stenosis: Secondary | ICD-10-CM | POA: Insufficient documentation

## 2015-06-23 DIAGNOSIS — R9431 Abnormal electrocardiogram [ECG] [EKG]: Secondary | ICD-10-CM | POA: Diagnosis not present

## 2015-06-23 DIAGNOSIS — I493 Ventricular premature depolarization: Secondary | ICD-10-CM | POA: Insufficient documentation

## 2015-06-23 DIAGNOSIS — I517 Cardiomegaly: Secondary | ICD-10-CM | POA: Diagnosis not present

## 2015-06-23 DIAGNOSIS — Z01812 Encounter for preprocedural laboratory examination: Secondary | ICD-10-CM | POA: Insufficient documentation

## 2015-06-23 DIAGNOSIS — I444 Left anterior fascicular block: Secondary | ICD-10-CM | POA: Insufficient documentation

## 2015-06-23 DIAGNOSIS — Z01818 Encounter for other preprocedural examination: Secondary | ICD-10-CM | POA: Insufficient documentation

## 2015-06-23 DIAGNOSIS — Z01810 Encounter for preprocedural cardiovascular examination: Secondary | ICD-10-CM | POA: Insufficient documentation

## 2015-06-23 LAB — URINALYSIS, ROUTINE W REFLEX MICROSCOPIC
Bilirubin Urine: NEGATIVE
Glucose, UA: NEGATIVE mg/dL
KETONES UR: NEGATIVE mg/dL
NITRITE: NEGATIVE
PH: 7 (ref 5.0–8.0)
PROTEIN: NEGATIVE mg/dL
Specific Gravity, Urine: 1.013 (ref 1.005–1.030)

## 2015-06-23 LAB — COMPREHENSIVE METABOLIC PANEL
ALT: 11 U/L — AB (ref 14–54)
AST: 25 U/L (ref 15–41)
Albumin: 3.9 g/dL (ref 3.5–5.0)
Alkaline Phosphatase: 63 U/L (ref 38–126)
Anion gap: 12 (ref 5–15)
BUN: 13 mg/dL (ref 6–20)
CHLORIDE: 102 mmol/L (ref 101–111)
CO2: 24 mmol/L (ref 22–32)
Calcium: 9.1 mg/dL (ref 8.9–10.3)
Creatinine, Ser: 0.65 mg/dL (ref 0.44–1.00)
GFR calc Af Amer: >60 mL/min (ref >60)
Glucose, Bld: 111 mg/dL — ABNORMAL HIGH (ref 65–99)
POTASSIUM: 3.8 mmol/L (ref 3.5–5.1)
SODIUM: 138 mmol/L (ref 135–145)
Total Bilirubin: 1.1 mg/dL (ref 0.3–1.2)
Total Protein: 6.8 g/dL (ref 6.5–8.1)

## 2015-06-23 LAB — URINE MICROSCOPIC-ADD ON

## 2015-06-23 LAB — BLOOD GAS, ARTERIAL
ACID-BASE EXCESS: 3.8 mmol/L — AB (ref 0.0–2.0)
Bicarbonate: 27.3 mEq/L — ABNORMAL HIGH (ref 20.0–24.0)
DRAWN BY: 206361
FIO2: 0.21
O2 SAT: 91.1 %
PATIENT TEMPERATURE: 98.6
PH ART: 7.472 — AB (ref 7.350–7.450)
TCO2: 28.5 mmol/L (ref 0–100)
pCO2 arterial: 37.8 mmHg (ref 35.0–45.0)
pO2, Arterial: 61.5 mmHg — ABNORMAL LOW (ref 80.0–100.0)

## 2015-06-23 LAB — PROTIME-INR
INR: 1.16 (ref 0.00–1.49)
PROTHROMBIN TIME: 15 s (ref 11.6–15.2)

## 2015-06-23 LAB — CBC
HCT: 43.3 % (ref 36.0–46.0)
Hemoglobin: 14.1 g/dL (ref 12.0–15.0)
MCH: 33.7 pg (ref 26.0–34.0)
MCHC: 32.6 g/dL (ref 30.0–36.0)
MCV: 103.3 fL — AB (ref 78.0–100.0)
PLATELETS: 184 10*3/uL (ref 150–400)
RBC: 4.19 MIL/uL (ref 3.87–5.11)
RDW: 14.5 % (ref 11.5–15.5)
WBC: 8.4 10*3/uL (ref 4.0–10.5)

## 2015-06-23 LAB — APTT: aPTT: 31 seconds (ref 24–37)

## 2015-06-23 LAB — SURGICAL PCR SCREEN
MRSA, PCR: NEGATIVE
STAPHYLOCOCCUS AUREUS: POSITIVE — AB

## 2015-06-23 LAB — ABO/RH: ABO/RH(D): O POS

## 2015-06-23 MED ORDER — CHLORHEXIDINE GLUCONATE 4 % EX LIQD: 60.0000 mL | Freq: Once | CUTANEOUS | Status: DC

## 2015-06-23 MED ORDER — CHLORHEXIDINE GLUCONATE 0.12 % MT SOLN: 15.0000 mL | Freq: Once | OROMUCOSAL | Status: DC

## 2015-06-24 LAB — HEMOGLOBIN A1C
HEMOGLOBIN A1C: 5.6 % (ref 4.8–5.6)
MEAN PLASMA GLUCOSE: 114 mg/dL

## 2015-06-26 MED ORDER — CHLORHEXIDINE GLUCONATE 4 % EX LIQD: 30.0000 mL | CUTANEOUS | Status: DC

## 2015-06-26 MED ORDER — NOREPINEPHRINE BITARTRATE 1 MG/ML IV SOLN
0.0000 ug/min | INTRAVENOUS | Status: DC
  Filled 2015-06-26: qty 4

## 2015-06-26 MED ORDER — VANCOMYCIN HCL 10 G IV SOLR
1250.0000 mg | INTRAVENOUS | Status: AC
  Administered 2015-06-27: 1250 mg via INTRAVENOUS
  Filled 2015-06-26: qty 1250

## 2015-06-26 MED ORDER — DOPAMINE-DEXTROSE 3.2-5 MG/ML-% IV SOLN
0.0000 ug/kg/min | INTRAVENOUS | Status: DC
Start: 2015-06-27 — End: 2015-06-27
  Filled 2015-06-26: qty 250

## 2015-06-26 MED ORDER — INSULIN REGULAR HUMAN 100 UNIT/ML IJ SOLN
INTRAMUSCULAR | Status: DC
  Filled 2015-06-26: qty 2.5

## 2015-06-26 MED ORDER — DEXTROSE 5 % IV SOLN
30.0000 ug/min | INTRAVENOUS | Status: DC
  Filled 2015-06-26: qty 2

## 2015-06-26 MED ORDER — POTASSIUM CHLORIDE 2 MEQ/ML IV SOLN
80.0000 meq | INTRAVENOUS | Status: DC
  Filled 2015-06-26: qty 40

## 2015-06-26 MED ORDER — NITROGLYCERIN IN D5W 200-5 MCG/ML-% IV SOLN
2.0000 ug/min | INTRAVENOUS | Status: DC
  Filled 2015-06-26: qty 250

## 2015-06-26 MED ORDER — DEXMEDETOMIDINE HCL IN NACL 400 MCG/100ML IV SOLN
0.1000 ug/kg/h | INTRAVENOUS | Status: DC
  Filled 2015-06-26: qty 100

## 2015-06-26 MED ORDER — EPINEPHRINE HCL 1 MG/ML IJ SOLN
0.0000 ug/min | INTRAVENOUS | Status: DC
  Filled 2015-06-26: qty 4

## 2015-06-26 MED ORDER — MAGNESIUM SULFATE 50 % IJ SOLN
40.0000 meq | INTRAMUSCULAR | Status: DC
  Filled 2015-06-26: qty 10

## 2015-06-26 MED ORDER — SODIUM CHLORIDE 0.9 % IV SOLN
INTRAVENOUS | Status: DC
Start: 2015-06-27 — End: 2015-06-27

## 2015-06-26 MED ORDER — HEPARIN SODIUM (PORCINE) 1000 UNIT/ML IJ SOLN
INTRAMUSCULAR | Status: DC
Start: 2015-06-27 — End: 2015-06-27
  Filled 2015-06-26: qty 30

## 2015-06-26 MED ORDER — DEXTROSE 5 % IV SOLN
1.5000 g | INTRAVENOUS | Status: AC
  Administered 2015-06-27: 1.5 g via INTRAVENOUS
  Filled 2015-06-26: qty 1.5

## 2015-06-26 NOTE — Progress Notes (Signed)
Anesthesia chart review: Patient is a 81 year old female scheduled for TAVR, transfemoral approach on 06/27/15 by Dr. Burt Knack.  History includes severe AS, afib, chronic diastolic CHF, SOB, non-smoker, HLD, HTN, anemia, chronic fatigue, depression, hypothyroidism, rosacea, breast cancer s/p right mastectomy, tonsillectomy, hysterectomy. BMI is consistent with obesity. PCP is Dr. Lajean Manes. Primary cardiologist is Dr. Kirk Ruths. Notes indicate that her husband is a retired [employer] and previously ran the [place] at [facility].  Meds include Cardizem, Eliquis, Flonase, Lasix, losartan, fish oil, Synthroid, Effexor XR. Eliquis held starting 06/21/15.   PAT Vitals: HR 114 (99 by EKG), BP 129/83, RR 20, T 36.5C, 95% RA.  05/15/15 RHC/LHC: Conclusion: 1. Severe aortic stenosis. 2. Mild nonobstructive CAD. 25% ostial LM. 20-25% distal LM. 25% proximal LAD. Mild calcification RCA without significant obstruction. LCX with no significant stenosis. 3. Normal LV function. 4. Moderate pulmonary hypertension. Recommendation: Cardiac surgical evaluation for TAVR versus conventional AVR.  04/14/15 Echo: Study Conclusions - Left ventricle: The cavity size was normal. Wall thickness was increased in a pattern of mild LVH. Systolic function was normal. The estimated ejection fraction was in the range of 55% to 60%. Wall motion was normal; there were no regional wall motion abnormalities. Doppler parameters are consistent with high ventricular filling pressure. - Aortic valve: Valve mobility was severely restricted. There was severe stenosis. There was trivial regurgitation. - Mitral valve: Calcified annulus. There was moderate regurgitation. - Left atrium: The atrium was severely dilated. - Right atrium: The atrium was moderately dilated. - Pulmonary arteries: Systolic pressure was moderately increased. - Pericardium, extracardiac: A trivial pericardial effusion was identified.  06/23/15  EKG: afib at 99 bpm with premature ventricular or aberrantly conducted complexes, LAFB, cannot rule out inferior infarct, age undetermined). No significant change since last tracing.   Preoperative CXR, cardiac CT, CTA chest/abd/pelvis, PFTs noted.   Preoperative labs noted. A1c 5.6. UA showed large leukocytes, but negative nitrites.  UA results and heart rate (suboptimal/borderline afib rate control) called to [facility] [employer]. She will have Dr. Burt Knack review.   Further evaluation by his surgeon(s) and anesthesiologist on the day of surgery to ensure no acute changes prior to proceeding.  [facility] Short Stay Center/Anesthesiology Phone [phone] 06/26/2015 11:05 AM

## 2015-06-26 NOTE — H&P (Signed)
[address]       [place] [address]             [phone]      Cardiothoracic Surgery History and Physical  Referring Provider is Stanford Breed, Denice Bors, MD PCP is Mathews Argyle, MD  Chief Complaint  Patient presents with  . Aortic Stenosis      . Shortness of Breath    HPI:  The patient is an 82 year old woman with atrial fibrillation, hypertension, hyperlipidemia and aortic stenosis that was mild by doppler in January 2015 with a mean gradient of 18 mm Hg. A follow up echo in 12/2014 showed progression of aortic stenosis with severely calcified leaflets and a mean gradient of 43 mm Hg. Her next echo on 04/14/2015 showed a mean gradient of 54 mm Hg. She underwent R/L heart cath on 05/15/2015 showing mild non-obstructive coronary disease with a mean aortic valve gradient of 25 mm Hg and a peak of 37 mm Hg with moderate pulmonary hypertension. She reports that she has dyspnea on exertion and decreased energy but has had no chest pain, or dizziness.  She lives at home with her husband, Dr. Artist Beach who used to be the head of the IRB at [facility]. She is not very active due to severe degenerative arthritis in her knees.     Past Medical History  Diagnosis Date  . FIBRILLATION, ATRIAL   . HYPERLIPIDEMIA   . HYPERTENSION, BENIGN SYSTEMIC   . ANEMIA NOS   . BICEPS TENDON RUPTURE, LEFT   . Closed anterior dislocation of humerus   . DEPRESSION, MAJOR, RECURRENT   . FATIGUE, CHRONIC   . HYPOTHYROIDISM NOS   . OBESITY NOS   . OSTEOARTHRITIS, MULTI SITES   . Rosacea   . SUBACROMIAL BURSITIS, RIGHT   . Breast cancer [facility])     Past Surgical History  Procedure Laterality Date  . Mastectomy Right   . Tonsillectomy    . Abdominal hysterectomy    . Cardiac catheterization N/A 05/15/2015    Procedure: Right/Left Heart Cath and Coronary Angiography; Surgeon: Sherren Mocha, MD;  Location: [facility]; Service: Cardiovascular; Laterality: N/A;    Family History  Problem Relation Age of Onset  . CAD Mother     Social History   Social History  . Marital Status: Married    Spouse Name: N/A  . Number of Children: 2  . Years of Education: N/A   Occupational History  .     Social History Main Topics  . Smoking status: Never Smoker   . Smokeless tobacco: Not on file  . Alcohol Use: Yes     Comment: 3 drinks per night  . Drug Use: Not on file  . Sexual Activity: Not on file   Other Topics Concern  . Not on file   Social History Narrative    Current Outpatient Prescriptions  Medication Sig Dispense Refill  . acetaminophen (TYLENOL) 500 MG tablet Take 1,000 mg by mouth 2 (two) times daily as needed for mild pain or headache.    . Dextromethorphan-Guaifenesin (CORICIDIN HBP CONGESTION/COUGH PO) Take by mouth.    . diltiazem (CARDIZEM CD) 180 MG 24 hr capsule Take 1 capsule (180 mg total) by mouth daily. 30 capsule 12  . ELIQUIS 5 MG TABS tablet Take 5 mg by mouth 2 (two) times daily.     . fluticasone (FLONASE) 50 MCG/ACT nasal spray Place 2 sprays into both nostrils  daily.     . furosemide (LASIX) 20 MG tablet Take 20 mg by mouth daily.     Marland Kitchen levothyroxine (SYNTHROID, LEVOTHROID) 50 MCG tablet Take 50 mcg by mouth daily. 75 MCG ON mwf AND 50 MCG ALL OTHERS    . losartan (COZAAR) 50 MG tablet Take 1 tablet (50 mg total) by mouth daily. 90 tablet 3  . Omega-3 Fatty Acids (FISH OIL) 1000 MG CAPS Take 1,000 mg by mouth 2 (two) times daily.    Marland Kitchen SYNTHROID 75 MCG tablet Take 1 tablet by mouth. 75mg  Monday Wednesday Friday 50mg  Tuesday Thursday Saturday Sunday    . tretinoin (RETIN-A) 0.05 % cream Apply 1 application topically daily as needed (for skin). Apply a small amount to skin daily    . venlafaxine XR (EFFEXOR-XR) 37.5  MG 24 hr capsule Take 37.5 mg by mouth daily.      No current facility-administered medications for this visit.    No Known Allergies    Review of Systems:  General:normal appetite, decreaed energy, no weight gain, no weight loss, no fever Cardiac:no chest pain with exertion, no chest pain at rest, moderate SOB with moderate exertion, no resting SOB, no PND, no orthopnea, frequent palpitations, persistent atrial fibrillation, some LE edema, no dizzy spells, no syncope Respiratory:exertional shortness of breath, no home oxygen, no productive cough, no dry cough, no bronchitis, no wheezing, no hemoptysis, no asthma, no pain with inspiration or cough, no sleep apnea, no CPAP at night GI:no difficulty swallowing, no reflux, no frequent heartburn, no hiatal hernia, no abdominal pain, has constipation, no diarrhea, no hematochezia, no hematemesis, no melena GU:no dysuria, no frequency, no urinary tract infection, no hematuria, no kidney stones, no kidney disease Vascular:has pain suggestive of claudication, no pain in feet, no leg cramps, no varicose veins, no DVT, no non-healing foot ulcer Neuro:no stroke, no TIA\'s, no seizures, no headaches, notemporary blindness one eye, no slurred speech, no peripheral neuropathy, no chronic pain, no instability of gait, no memory/cognitive dysfunction Musculoskeletal:has arthritis in both knees, no joint swelling, no myalgias, has difficulty walking, decreased mobility  Skin:no rash, no itching, no skin infections, no pressure sores or ulcerations Psych:no anxiety, no depression, no nervousness, no unusual  recent stress Eyes:no blurry vision, no floaters, no recent vision changes, wears glasses or contacts ENT:no hearing loss, no loose or painful teeth, no dentures, last saw dentist couple months ago Hematologic:has easy bruising, no abnormal bleeding, no clotting disorder, no frequent epistaxis Endocrine:no diabetes, does not check CBG\'s at home       Physical Exam:  BP 110/64 mmHg  Pulse 104  Resp 16  Ht 5\' 2" (1.575 m)  Wt 165 lb (74.844 kg)  BMI 30.17 kg/m2  SpO2 93% General:Elderly but well-appearing HEENT:Unremarkable , NCAT, PERLA, EOMI, oropharynx clear Neck:no JVD, no bruits, no adenopathy or thyromegaly Chest:clear to auscultation, symmetrical breath sounds, no wheezes, no rhonchi  CV:IRRR, grade III/VI crescendo/decrescendo murmur heard best at RSB, no diastolic murmur Abdomen:soft, non-tender, no masses or organomegaly Extremities:warm, well-perfused, pulses not palpable in feet, no LE edema Rectal/GUDeferred Neuro:Grossly non-focal and symmetrical throughout Skin:Clean and dry, no rashes, no breakdown   Diagnostic Tests:              *[facility]*            [address]            [place], [place] [address]  [phone]  ------------------------------------------------------------------- Transthoracic Echocardiography  Patient:  Riquel, Zottola MR #:    [id number] Study Date: 04/14/2015 Gender:   F Age:    39 Height:   157.5 cm Weight:   81.6 kg BSA:    1.92 m^2 Pt. Status: Room:  SONOGRAPHER Charlann Noss, RDCS ATTENDING  Dorris Carnes, M.D. ORDERING   Kilroy, Luke K REFERRING  Vandiver, Luke K PERFORMING  [facility], Outpatient  cc:  ------------------------------------------------------------------- LV EF: 55% -  60%  ------------------------------------------------------------------- Indications:   Aortic stenosis 424.1.  ------------------------------------------------------------------- History:  PMH: Acquired from the patient and from the patient&'s chart. Fatigue, dyspnea, and bilateral lower extremity edema. Atrial fibrillation. Aortic valve disease. Risk factors: Hypertension. Obese.  ------------------------------------------------------------------- Study Conclusions  - Left ventricle: The cavity size was normal. Wall thickness was increased in a pattern of mild LVH. Systolic function was normal. The estimated ejection fraction was in the range of 55% to 60%. Wall motion was normal; there were no regional wall motion abnormalities. Doppler parameters are consistent with high ventricular filling pressure. - Aortic valve: Valve mobility was severely restricted. There was severe stenosis. There was trivial regurgitation. - Mitral valve: Calcified annulus. There was moderate regurgitation. - Left atrium: The atrium was severely dilated. - Right atrium: The atrium was moderately dilated. - Pulmonary arteries: Systolic pressure was moderately increased. - Pericardium, extracardiac: A trivial pericardial effusion was identified.  Impressions:  - Normal LV systolic function; mild LVH; elevated LV filling pressure; biatrial enlargement; thickened aortic valve with severe AS (mean gradient 54 mmHg) and trace AI; moderate MR; mild TR; moderately  elevated pulmonary pressure.  ------------------------------------------------------------------- Labs, prior tests, procedures, and surgery: Echocardiography (August 2016).  Transthoracic echocardiography. M-mode, complete 2D, spectral Doppler, and color Doppler. Birthdate: Patient birthdate: Sep 08, 1930. Age: Patient is 82 yr old. Sex: Gender: female. BMI: 32.9 kg/m^2. Blood pressure:   132/84 Patient status: Outpatient. Study date: Study date: 04/14/2015. Study time: 11:52 AM. Location: [facility]  -------------------------------------------------------------------  ------------------------------------------------------------------- Left ventricle: The cavity size was normal. Wall thickness was increased in a pattern of mild LVH. Systolic function was normal. The estimated ejection fraction was in the range of 55% to 60%. Wall motion was normal; there were no regional wall motion abnormalities. The study was not technically sufficient to allow evaluation of LV diastolic dysfunction due to atrial fibrillation. Doppler parameters are consistent with high ventricular filling pressure.  ------------------------------------------------------------------- Aortic valve:  Trileaflet; mildly thickened leaflets. Valve mobility was severely restricted. Doppler:  There was severe stenosis.  There was trivial regurgitation.  VTI ratio of LVOT to aortic valve: 0.18. Indexed valve area (VTI): 0.29 cm^2/m^2. Mean velocity ratio of LVOT to aortic valve: 0.1. Indexed valve area (Vmean): 0.17 cm^2/m^2.  Mean gradient (S): 54 mm Hg.  ------------------------------------------------------------------- Aorta: Aortic root: The aortic root was normal in size.  ------------------------------------------------------------------- Mitral valve:  Calcified annulus. Mobility was not restricted. Doppler: Transvalvular velocity was within the normal range. There was no  evidence for stenosis. There was moderate regurgitation. Peak gradient (D): 11 mm Hg.  ------------------------------------------------------------------- Left atrium: The atrium was severely dilated.  ------------------------------------------------------------------- Right ventricle: The cavity size was normal. Systolic function was normal.  ------------------------------------------------------------------- Pulmonic valve:  Doppler: Transvalvular velocity was within the normal range. There was no evidence for stenosis. There was trivial regurgitation.  ------------------------------------------------------------------- Tricuspid valve:  Structurally normal valve.  Doppler: Transvalvular velocity was within the normal range. There was mild regurgitation.  ------------------------------------------------------------------- Pulmonary artery:  Systolic pressure was moderately increased.  ------------------------------------------------------------------- Right  atrium: The atrium was moderately dilated.  ------------------------------------------------------------------- Pericardium: A trivial pericardial effusion was identified.  ------------------------------------------------------------------- Systemic veins: Inferior vena cava: The vessel was normal in size.  ------------------------------------------------------------------- Measurements  Left ventricle             Value       Reference LV ID, ED, PLAX chordal    (L)   38.1 mm     43 - 52 LV ID, ES, PLAX chordal        25.7 mm     23 - 38 LV fx shortening, PLAX chordal     33  %      >=29 LV PW thickness, ED          11.9 mm     --------- IVS/LV PW ratio, ED          0.85       <=1.3 Stroke volume, 2D           46  ml     --------- Stroke volume/bsa, 2D         24  ml/m^2    --------- LV e&', lateral             9.76 cm/s    --------- LV E/e&', lateral            17.32       ---------  Ventricular septum           Value       Reference IVS thickness, ED           10.1 mm     ---------  LVOT                  Value       Reference LVOT ID, S               20  mm     --------- LVOT area               3.14 cm^2    --------- LVOT ID                20  mm     --------- LVOT peak velocity, S         69.2 cm/s    --------- LVOT mean velocity, S         46.6 cm/s    --------- LVOT VTI, S              14.6 cm     --------- Stroke volume (SV), LVOT DP      45.9 ml     --------- Cardiac output (Qs), LVOT DP      4.1  L/min    --------- Cardiac index (Qs/bsa), LVOT      2.1  L/(min-m^2) --------- DP Stroke index (SV/bsa), LVOT DP     23.8 ml/m^2   ---------  Aortic valve              Value       Reference Aortic valve mean velocity, S     454  cm/s    --------- Aortic valve VTI, S          82.6 cm     --------- Aortic mean gradient, S        54  mm Hg    --------- VTI ratio, LVOT/AV           0.18       --------- Aortic valve area/bsa,  VTI       0.29 cm^2/m^2  --------- Velocity ratio, mean, LVOT/AV     0.1        --------- Aortic valve area/bsa, mean      0.17 cm^2/m^2  --------- velocity  Aorta                 Value       Reference Aortic root ID, ED           32  mm     ---------  Left atrium              Value       Reference LA ID, A-P, ES             42  mm     --------- LA ID/bsa, A-P              2.18 cm/m^2   <=2.2 LA volume, S              148  ml     --------- LA volume/bsa, S            76.9 ml/m^2   --------- LA volume, ES, 1-p A4C         107  ml     --------- LA volume/bsa, ES, 1-p A4C       55.6 ml/m^2   --------- LA volume, ES, 1-p A2C         178  ml     --------- LA volume/bsa, ES, 1-p A2C       92.5 ml/m^2   ---------  Mitral valve              Value       Reference Mitral E-wave peak velocity      169  cm/s    --------- Mitral A-wave peak velocity      54.2 cm/s    --------- Mitral deceleration time        162  ms     150 - 230 Mitral peak gradient, D        11  mm Hg    --------- Mitral E/A ratio, peak         3.1        ---------  Tricuspid valve            Value       Reference Tricuspid regurg peak velocity     320  cm/s    --------- Tricuspid peak RV-RA gradient     41  mm Hg    ---------  Right ventricle            Value       Reference RV s&', lateral, S           7.46 cm/s    ---------  Legend: (L) and (H) mark values outside specified reference range.  ------------------------------------------------------------------- Prepared and Electronically Authenticated by  Kirk Ruths 2016-12-16T10:05:51  Sherren Mocha, MD (Primary)      Procedures    Right/Left Heart Cath and Coronary Angiography    Conclusion    1. Severe aortic stenosis  2. Mild nonobstructive CAD 3. Normal LV function 4. Moderate pulmonary HTN  Cardiac surgical evaluation for TAVR versus conventional AVR    Indications    Severe aortic stenosis [I35.0 (ICD-10-CM)]    Technique and Indications    INDICATION: Severe aortic stenosis  PROCEDURAL DETAILS: There was an indwelling IV in a  right antecubital vein.  Using normal sterile technique, the IV was changed out for a 5 Fr brachial sheath over a 0.018 inch wire. The right wrist was then prepped, draped, and anesthetized with 1% lidocaine. Using the modified Seldinger technique a 5/6 French Slender sheath was placed in the right radial artery. Intra-arterial verapamil was administered through the radial artery sheath. IV heparin was administered after a JR4 catheter was advanced into the central aorta. A Swan-Ganz catheter was used for the right heart catheterization. Standard protocol was followed for recording of right heart pressures and sampling of oxygen saturations. Fick cardiac output was calculated. Standard Judkins catheters were used for selective coronary angiography, aortic root angiography, and left ventriculography. There were no immediate procedural complications. The patient was transferred to the post catheterization recovery area for further monitoring.    Estimated blood loss <50 mL. There were no immediate complications during the procedure.    Coronary Findings    Dominance: Right   Left Main   . Ost LM to LM lesion, 25% stenosed. Calcified. The left main is mildly calcified. The distal vessel has 20-25% stenosis without significant obstruction     Left Anterior Descending   . Prox LAD lesion, 25% stenosed. Calcified. The LAD is mildly calcified. The vessel reaches the LV apex. There is a large first diagonal branch with no significant stenosis. There is mild irregularity of the LAD but no significant stenosis is present.     Left Circumflex  The left circumflex distribution is small and there is no significant stenosis present.     Right Coronary Artery  The RCA is a large, dominant vessel with mild calcification and no significant obstruction. The vessel supplies a large PDA and PLA branch.       Right Heart Pressures Hemodynamic findings consistent with aortic  stenosis. Elevated LV EDP consistent with volume overload.    Wall Motion                 Left Heart    Aortic Valve , and no aortic valve regurgitation. The aortic valve is calcified. There is restricted aortic valve motion. Peak-to-Peak 37 mmHg, mean 25 mmHg, AVA 0.8 square cm    Coronary Diagrams    Diagnostic Diagram            Implants    Name ID Temporary Type Supply   No information to display    PACS Images    Show images for Cardiac catheterization     Link to Procedure Log    Procedure Log      Hemo Data       Most Recent Value   Fick Cardiac Output  3.94 L/min   Fick Cardiac Output Index  2.2 (L/min)/BSA   Aortic Mean Gradient  25.2 mmHg   Aortic Peak Gradient  37 mmHg   Aortic Valve Area  0.81   Aortic Value Area Index  0.45 cm2/BSA   RA A Wave  -99 mmHg   RA V Wave  16 mmHg   RA Mean  13 mmHg   RV Systolic Pressure  63 mmHg   RV Diastolic Pressure  2 mmHg   RV EDP  9 mmHg   PA Systolic Pressure  63 mmHg   PA Diastolic Pressure  26 mmHg   PA Mean  39 mmHg   PW A Wave  -99 mmHg   PW V Wave  22 mmHg   PW Mean  22 mmHg   AO Systolic Pressure  [id number] mmHg  AO Diastolic Pressure  78 mmHg   AO Mean  123XX123 mmHg   LV Systolic Pressure  0000000 mmHg   LV Diastolic Pressure  10 mmHg   LV EDP  16 mmHg   Arterial Occlusion Pressure Extended Systolic Pressure  [id number] mmHg   Arterial Occlusion Pressure Extended Diastolic Pressure  83 mmHg   Arterial Occlusion Pressure Extended Mean Pressure  105 mmHg   Left Ventricular Apex Extended Systolic Pressure  123XX123 mmHg   Left Ventricular Apex Extended Diastolic Pressure  13 mmHg   Left Ventricular Apex Extended EDP Pressure  19 mmHg   QP/QS  1   TPVR Index  17.74 HRUI   TSVR Index  45.48 HRUI    PVR SVR Ratio  0.2   TPVR/TSVR Ratio  0.39   ADDENDUM REPORT: 05/29/2015 13:59 CLINICAL DATA: Aortic stenosis EXAM: Cardiac TAVR CT TECHNIQUE: The patient was scanned on a Philips 256 scanner. A 120 kV retrospective scan was triggered in the descending thoracic aorta at 111 HU's. Gantry rotation speed was 270 msecs and collimation was .9 mm. No beta blockade or nitro were given. The 3D data set was reconstructed in 5% intervals of the R-R cycle. Systolic and diastolic phases were analyzed on a dedicated work station using MPR, MIP and VRT modes. The patient received 80 cc of contrast. FINDINGS: Aortic Valve: Tri-leaflet and heavily calcified. Moderate calcification of the STJ Aorta: Mild calcification of the arch and descending thoracic aorta. Bovine Arch Sinotubular Junction: 27 mm Ascending Thoracic Aorta: 37 mm Aortic Arch: 25 mm Sinus of Valsalva Measurements: Non-coronary: 28 mm Right -coronary: 28 mm Left -coronary: 27 mm Coronary Artery Height above Annulus: Left Main: 13.4 mm Right Coronary: 13.8 mm Virtual Basal Annulus Measurements: Maximum/Minimum Diameter: 25 mm x 20.6 mm Perimeter: 72 mm Area: 400 mm2 Coronary Arteries: Sufficient height above annulus for deployment Optimum Fluoroscopic Angle for Delivery: Unable to determine but appears to required steep caudal angulation in AP view IMPRESSION: 1) Calcified tri-leaflet aortic valve with annulus 400 mm2 suitable for a 23 mm Sapien 3 valve 2) Suitable coronary height above annulus for deployment 3) Bovine Arch with no contraindications to femoral or thoracic delivery 4) No LAA thrombus 5) Angiographic angle for delivery difficult to determine but would appear to need significant caudle angulation in AP view Jenkins Rouge Electronically Signed  By: Jenkins Rouge M.D.  On: 05/29/2015 13:59     Study Result     EXAM: OVER-READ INTERPRETATION CT CHEST  The following report is  an over-read performed by radiologist Dr. Rebekah [facility] on 05/29/2015. This over-read does not include interpretation of cardiac or coronary anatomy or pathology. The coronary calcium score/coronary CTA interpretation by the cardiologist is attached.  COMPARISON: No priors.  FINDINGS: Noncardiac findings will be dictated under separate accession for contemporaneously obtained CTA of the chest, abdomen and pelvis.  IMPRESSION: See separate dictation for contemporaneously obtained CTA of the chest, abdomen and pelvis for full description of extracardiac findings.  Electronically Signed: By: Vinnie Langton M.D. On: 05/29/2015 13:19   CLINICAL DATA: 82 year old female with history of severe aortic stenosis. Preprocedural study prior to potential transcatheter aortic valve replacement (TAVR).  EXAM: CT ANGIOGRAPHY CHEST, ABDOMEN AND PELVIS  TECHNIQUE: Multidetector CT imaging through the chest, abdomen and pelvis was performed using the standard protocol during bolus administration of intravenous contrast. Multiplanar reconstructed images and MIPs were obtained and reviewed to evaluate the vascular anatomy.  CONTRAST: 41mL OMNIPAQUE IOHEXOL 350 MG/ML SOLN  COMPARISON:  No priors.  FINDINGS: CTA CHEST FINDINGS  Mediastinum/Lymph Nodes: Heart size is mildly enlarged. There is no significant pericardial fluid, thickening or pericardial calcification. Atherosclerotic calcifications in the left anterior descending and right coronary arteries. Severe calcifications of the aortic valve. Dilatation of the main pulmonary artery (3.5 cm in diameter), suggesting underlying pulmonary arterial hypertension. No lymphadenopathy noted in the mediastinal or hilar nodal stations. Esophagus is normal in appearance. No axillary lymphadenopathy.  Lungs/Pleura: Mild diffuse interlobular septal thickening with patchy areas of ground-glass attenuation  throughout the lungs bilaterally, suggesting a background of mild interstitial pulmonary edema. Mild pleural thickening and/or trace pleural effusion in the left hemithorax.  Musculoskeletal/Soft Tissues: Status post right modified radical mastectomy. There are no aggressive appearing lytic or blastic lesions noted in the visualized portions of the skeleton.  CTA ABDOMEN AND PELVIS FINDINGS  Hepatobiliary: No cystic or solid hepatic lesions. No intra or extrahepatic biliary ductal dilatation. Normal appearance of the gallbladder.  Pancreas: No pancreatic mass. No pancreatic ductal dilatation. No pancreatic or peripancreatic fluid or inflammatory changes.  Spleen: Unremarkable.  Adrenals/Urinary Tract: Mild bilateral renal atrophy. Several sub cm low-attenuation lesions in the kidneys bilaterally, too small to characterize, but favored to represent tiny cysts. No hydroureteronephrosis. Urinary bladder is unremarkable in appearance. Bilateral adrenal glands are normal in appearance.  Stomach/Bowel: Normal appearance of the stomach. No pathologic dilatation of small bowel or colon. Several colonic diverticulae are noted, most evident in the sigmoid colon, without surrounding inflammatory changes to suggest an acute diverticulitis at this time. Normal appendix.  Vascular/Lymphatic: Extensive atherosclerosis throughout the abdominal and pelvic vasculature, with vascular findings and measurements pertinent to potential TAVR, as discussed below. There is fusiform ectasia of the infrarenal abdominal aorta which measures up to 31 x 24 mm (mean diameter of 27 mm). Three right-sided renal arteries. Single left renal artery. Celiac axis, superior mesenteric artery and inferior mesenteric artery are all widely patent. No lymphadenopathy noted in the abdomen or pelvis.  Reproductive: Status post hysterectomy. Ovaries are not confidently identified may be surgically absent or  atrophic.  Other: No significant volume of ascites. No pneumoperitoneum.  Musculoskeletal: There are no aggressive appearing lytic or blastic lesions noted in the visualized portions of the skeleton.  VASCULAR MEASUREMENTS PERTINENT TO TAVR:  AORTA:  Minimal Aortic Diameter - 15 x 16 mm  Severity of Aortic Calcification - moderate to severe  RIGHT PELVIS:  Right Common Iliac Artery -  Minimal Diameter - 9.1 x 8.2 mm  Tortuosity - moderate  Calcification - mild  Right External Iliac Artery -  Minimal Diameter - 6.2 x 6.4 mm  Tortuosity - severe  Calcification - none  Right Common Femoral Artery -  Minimal Diameter - 6.6 x 5.5 mm  Tortuosity - moderate  Calcification - mild  LEFT PELVIS:  Left Common Iliac Artery -  Minimal Diameter - 8.1 x 6.6 mm  Tortuosity - severe  Calcification - moderate  Left External Iliac Artery -  Minimal Diameter - 6.5 x 6.9 mm  Tortuosity - severe  Calcification - none  Left Common Femoral Artery -  Minimal Diameter - 7.1 x 6.9 mm  Tortuosity - mild  Calcification - mild  Review of the MIP images confirms the above findings.  IMPRESSION: 1. Vascular findings and measurements pertinent to potential TAVR procedure, as detailed above. This patient does appear to have suitable pelvic arterial access bilaterally, however, due to the small caliber right common femoral artery, access may be more optimal on the  left side. 2. Severe calcification of the aortic valve, compatible with the reported clinical history of severe aortic stenosis. 3. Dilatation of the main pulmonary artery, suggestive of pulmonary arterial hypertension. 4. Additional incidental findings, as above.   Electronically Signed  By: Vinnie Langton M.D.  On: 05/29/2015 13:39  Impression:  The patient has stage D severe, symptomatic aortic stenosis with NYHA class II symptoms of exertional shortness of  breath and fatigue that has been progressing. I have personally reviewed her echo and cath. Her aortic valve is trileaflet with calcified leaflets with severe restricted mobility and a mean gradient of 54 mm Hg. I agree with Dr. Stanford Breed that her aortic valve should be replaced with severe stenosis and progressive symptoms. She is in the intermediate risk class at 82 years old with persistent atrial fibrillation and severe degenerative arthritis of her knees with limited mobility. She would have a slow recovery with surgical aortic valve replacement. I think TAVR would be a reasonable option for her. I have personally reviewed her cardiac CT and abdominal/pelvic CT. These show that she is a candidate for transfemoral TAVR with a Sapien 3 valve.   The patient was counseled at length regarding treatment alternatives for management of severe symptomatic aortic stenosis. The risks and benefits of surgical intervention has been discussed in detail. Long-term prognosis with medical therapy was discussed. Alternative approaches such as conventional surgical aortic valve replacement, transcatheter aortic valve replacement, and palliative medical therapy were compared and contrasted at length. This discussion was placed in the context of the patient's own specific clinical presentation and past medical history. All of their questions been addressed.   The patient has been advised of a variety of complications that might develop including but not limited to risks of death, stroke, paravalvular leak, aortic dissection or other major vascular complications, aortic annulus rupture, device embolization, cardiac rupture or perforation, mitral regurgitation, acute myocardial infarction, arrhythmia, heart block or bradycardia requiring permanent pacemaker placement, congestive heart failure, respiratory failure, renal failure, pneumonia, infection, other late complications related to structural valve deterioration or migration,  or other complications that might ultimately cause a temporary or permanent loss of functional independence or other long term morbidity. She would like to proceed with TAVR.    Plan:  Transfemoral TAVR on 06/27/2015    Gaye Pollack, MD

## 2015-06-27 ENCOUNTER — Inpatient Hospital Stay (HOSPITAL_COMMUNITY): Payer: Medicare Other | Admitting: Vascular Surgery

## 2015-06-27 ENCOUNTER — Inpatient Hospital Stay (HOSPITAL_COMMUNITY)
Admission: RE | Admit: 2015-06-27 | Discharge: 2015-06-29 | DRG: 267 | Disposition: A | Discharge status: Home / Self Care | Payer: Medicare Other | Source: Ambulatory Visit | Attending: Cardiovascular Disease | Admitting: Cardiovascular Disease

## 2015-06-27 ENCOUNTER — Encounter (HOSPITAL_COMMUNITY)
Admission: RE | Disposition: A | Discharge status: Home / Self Care | Payer: Self-pay | Source: Ambulatory Visit | Attending: Cardiovascular Disease

## 2015-06-27 ENCOUNTER — Encounter (HOSPITAL_COMMUNITY): Payer: Self-pay

## 2015-06-27 ENCOUNTER — Inpatient Hospital Stay (HOSPITAL_COMMUNITY): Payer: Medicare Other

## 2015-06-27 ENCOUNTER — Other Ambulatory Visit: Payer: Self-pay

## 2015-06-27 ENCOUNTER — Inpatient Hospital Stay (HOSPITAL_COMMUNITY): Payer: Medicare Other | Admitting: Certified Registered"

## 2015-06-27 DIAGNOSIS — I272 Other secondary pulmonary hypertension: Secondary | ICD-10-CM | POA: Diagnosis present

## 2015-06-27 DIAGNOSIS — E785 Hyperlipidemia, unspecified: Secondary | ICD-10-CM

## 2015-06-27 DIAGNOSIS — I340 Nonrheumatic mitral (valve) insufficiency: Secondary | ICD-10-CM

## 2015-06-27 DIAGNOSIS — I350 Nonrheumatic aortic (valve) stenosis: Secondary | ICD-10-CM

## 2015-06-27 DIAGNOSIS — G459 Transient cerebral ischemic attack, unspecified: Secondary | ICD-10-CM | POA: Diagnosis not present

## 2015-06-27 DIAGNOSIS — Z954 Presence of other heart-valve replacement: Secondary | ICD-10-CM | POA: Diagnosis not present

## 2015-06-27 DIAGNOSIS — I482 Chronic atrial fibrillation, unspecified: Secondary | ICD-10-CM | POA: Diagnosis present

## 2015-06-27 DIAGNOSIS — Z9011 Acquired absence of right breast and nipple: Secondary | ICD-10-CM

## 2015-06-27 DIAGNOSIS — M170 Bilateral primary osteoarthritis of knee: Secondary | ICD-10-CM | POA: Diagnosis present

## 2015-06-27 DIAGNOSIS — I5032 Chronic diastolic (congestive) heart failure: Secondary | ICD-10-CM | POA: Diagnosis present

## 2015-06-27 DIAGNOSIS — R5382 Chronic fatigue, unspecified: Secondary | ICD-10-CM

## 2015-06-27 DIAGNOSIS — E669 Obesity, unspecified: Secondary | ICD-10-CM | POA: Diagnosis present

## 2015-06-27 DIAGNOSIS — Z952 Presence of prosthetic heart valve: Secondary | ICD-10-CM

## 2015-06-27 DIAGNOSIS — Z853 Personal history of malignant neoplasm of breast: Secondary | ICD-10-CM | POA: Diagnosis not present

## 2015-06-27 DIAGNOSIS — I2510 Atherosclerotic heart disease of native coronary artery without angina pectoris: Secondary | ICD-10-CM | POA: Diagnosis present

## 2015-06-27 DIAGNOSIS — Z6832 Body mass index (BMI) 32.0-32.9, adult: Secondary | ICD-10-CM

## 2015-06-27 DIAGNOSIS — I110 Hypertensive heart disease with heart failure: Secondary | ICD-10-CM | POA: Diagnosis present

## 2015-06-27 DIAGNOSIS — Z7901 Long term (current) use of anticoagulants: Secondary | ICD-10-CM | POA: Diagnosis not present

## 2015-06-27 DIAGNOSIS — I10 Essential (primary) hypertension: Secondary | ICD-10-CM | POA: Diagnosis present

## 2015-06-27 DIAGNOSIS — Z79899 Other long term (current) drug therapy: Secondary | ICD-10-CM

## 2015-06-27 DIAGNOSIS — I480 Paroxysmal atrial fibrillation: Secondary | ICD-10-CM | POA: Diagnosis present

## 2015-06-27 DIAGNOSIS — Z8249 Family history of ischemic heart disease and other diseases of the circulatory system: Secondary | ICD-10-CM | POA: Diagnosis not present

## 2015-06-27 DIAGNOSIS — Z90710 Acquired absence of both cervix and uterus: Secondary | ICD-10-CM

## 2015-06-27 DIAGNOSIS — Z006 Encounter for examination for normal comparison and control in clinical research program: Secondary | ICD-10-CM

## 2015-06-27 HISTORY — DX: Nonrheumatic mitral (valve) insufficiency: I34.0

## 2015-06-27 HISTORY — PX: TEE WITHOUT CARDIOVERSION: SHX5443

## 2015-06-27 HISTORY — DX: Pulmonary hypertension, unspecified: I27.20

## 2015-06-27 HISTORY — DX: Obesity, unspecified: E66.9

## 2015-06-27 HISTORY — DX: Atherosclerotic heart disease of native coronary artery without angina pectoris: I25.10

## 2015-06-27 HISTORY — DX: Hyperlipidemia, unspecified: E78.5

## 2015-06-27 HISTORY — PX: TRANSCATHETER AORTIC VALVE REPLACEMENT, TRANSFEMORAL: SHX6400

## 2015-06-27 HISTORY — DX: Essential (primary) hypertension: I10

## 2015-06-27 HISTORY — DX: Hypothyroidism, unspecified: E03.9

## 2015-06-27 LAB — POCT I-STAT, CHEM 8
BUN: 18 mg/dL (ref 6–20)
BUN: 16 mg/dL (ref 6–20)
BUN: 16 mg/dL (ref 6–20)
BUN: 14 mg/dL (ref 6–20)
CALCIUM ION: 1.17 mmol/L (ref 1.13–1.30)
CHLORIDE: 100 mmol/L — AB (ref 101–111)
CHLORIDE: 100 mmol/L — AB (ref 101–111)
CHLORIDE: 97 mmol/L — AB (ref 101–111)
CREATININE: 0.5 mg/dL (ref 0.44–1.00)
CREATININE: 0.5 mg/dL (ref 0.44–1.00)
CREATININE: 0.5 mg/dL (ref 0.44–1.00)
Calcium, Ion: 1.11 mmol/L — ABNORMAL LOW (ref 1.13–1.30)
Calcium, Ion: 1.15 mmol/L (ref 1.13–1.30)
Calcium, Ion: 1.13 mmol/L (ref 1.13–1.30)
Chloride: 101 mmol/L (ref 101–111)
Creatinine, Ser: 0.5 mg/dL (ref 0.44–1.00)
GLUCOSE: 122 mg/dL — AB (ref 65–99)
Glucose, Bld: 112 mg/dL — ABNORMAL HIGH (ref 65–99)
Glucose, Bld: 122 mg/dL — ABNORMAL HIGH (ref 65–99)
Glucose, Bld: 110 mg/dL — ABNORMAL HIGH (ref 65–99)
HEMATOCRIT: 42 % (ref 36.0–46.0)
HEMATOCRIT: 38 % (ref 36.0–46.0)
HEMATOCRIT: 38 % (ref 36.0–46.0)
HEMATOCRIT: 39 % (ref 36.0–46.0)
HEMOGLOBIN: 12.9 g/dL (ref 12.0–15.0)
HEMOGLOBIN: 13.3 g/dL (ref 12.0–15.0)
Hemoglobin: 14.3 g/dL (ref 12.0–15.0)
Hemoglobin: 12.9 g/dL (ref 12.0–15.0)
POTASSIUM: 3.8 mmol/L (ref 3.5–5.1)
POTASSIUM: 3.6 mmol/L (ref 3.5–5.1)
POTASSIUM: 3.5 mmol/L (ref 3.5–5.1)
POTASSIUM: 4.4 mmol/L (ref 3.5–5.1)
SODIUM: 137 mmol/L (ref 135–145)
SODIUM: 137 mmol/L (ref 135–145)
Sodium: 137 mmol/L (ref 135–145)
Sodium: 137 mmol/L (ref 135–145)
TCO2: 28 mmol/L (ref 0–100)
TCO2: 26 mmol/L (ref 0–100)
TCO2: 26 mmol/L (ref 0–100)
TCO2: 27 mmol/L (ref 0–100)

## 2015-06-27 LAB — POCT I-STAT 3, ART BLOOD GAS (G3+)
ACID-BASE EXCESS: 1 mmol/L (ref 0.0–2.0)
ACID-BASE EXCESS: 1 mmol/L (ref 0.0–2.0)
Acid-Base Excess: 1 mmol/L (ref 0.0–2.0)
BICARBONATE: 26.3 meq/L — AB (ref 20.0–24.0)
BICARBONATE: 28.4 meq/L — AB (ref 20.0–24.0)
BICARBONATE: 29.3 meq/L — AB (ref 20.0–24.0)
BICARBONATE: 27.1 meq/L — AB (ref 20.0–24.0)
O2 SAT: 95 %
O2 SAT: 95 %
O2 Saturation: 96 %
O2 Saturation: 94 %
PCO2 ART: 58.5 mmHg — AB (ref 35.0–45.0)
PCO2 ART: 60.8 mmHg — AB (ref 35.0–45.0)
PCO2 ART: 49.7 mmHg — AB (ref 35.0–45.0)
PH ART: 7.365 (ref 7.350–7.450)
PO2 ART: 85 mmHg (ref 80.0–100.0)
PO2 ART: 83 mmHg (ref 80.0–100.0)
PO2 ART: 82 mmHg (ref 80.0–100.0)
Patient temperature: 97.5
TCO2: 28 mmol/L (ref 0–100)
TCO2: 30 mmol/L (ref 0–100)
TCO2: 31 mmol/L (ref 0–100)
TCO2: 29 mmol/L (ref 0–100)
pCO2 arterial: 46.1 mmHg — ABNORMAL HIGH (ref 35.0–45.0)
pH, Arterial: 7.291 — ABNORMAL LOW (ref 7.350–7.450)
pH, Arterial: 7.289 — ABNORMAL LOW (ref 7.350–7.450)
pH, Arterial: 7.343 — ABNORMAL LOW (ref 7.350–7.450)
pO2, Arterial: 74 mmHg — ABNORMAL LOW (ref 80.0–100.0)

## 2015-06-27 LAB — APTT: aPTT: 33 seconds (ref 24–37)

## 2015-06-27 LAB — GLUCOSE, CAPILLARY: Glucose-Capillary: 105 mg/dL — ABNORMAL HIGH (ref 65–99)

## 2015-06-27 LAB — CREATININE, SERUM
CREATININE: 0.5 mg/dL (ref 0.44–1.00)
GFR calc Af Amer: >60 mL/min (ref >60)

## 2015-06-27 LAB — CBC
HEMATOCRIT: 40.9 % (ref 36.0–46.0)
HEMATOCRIT: 36.5 % (ref 36.0–46.0)
HEMOGLOBIN: 11.4 g/dL — AB (ref 12.0–15.0)
Hemoglobin: 13.2 g/dL (ref 12.0–15.0)
MCH: 34 pg (ref 26.0–34.0)
MCH: 32.4 pg (ref 26.0–34.0)
MCHC: 32.3 g/dL (ref 30.0–36.0)
MCHC: 31.2 g/dL (ref 30.0–36.0)
MCV: 105.4 fL — AB (ref 78.0–100.0)
MCV: 103.7 fL — ABNORMAL HIGH (ref 78.0–100.0)
PLATELETS: 138 10*3/uL — AB (ref 150–400)
Platelets: 140 10*3/uL — ABNORMAL LOW (ref 150–400)
RBC: 3.88 MIL/uL (ref 3.87–5.11)
RBC: 3.52 MIL/uL — ABNORMAL LOW (ref 3.87–5.11)
RDW: 14.6 % (ref 11.5–15.5)
RDW: 14.4 % (ref 11.5–15.5)
WBC: 8.5 10*3/uL (ref 4.0–10.5)
WBC: 8.1 10*3/uL (ref 4.0–10.5)

## 2015-06-27 LAB — PROTIME-INR
INR: 1.32 (ref 0.00–1.49)
Prothrombin Time: 16.5 seconds — ABNORMAL HIGH (ref 11.6–15.2)

## 2015-06-27 LAB — MAGNESIUM: MAGNESIUM: 1.8 mg/dL (ref 1.7–2.4)

## 2015-06-27 LAB — POCT I-STAT 4, (NA,K, GLUC, HGB,HCT)
Glucose, Bld: 134 mg/dL — ABNORMAL HIGH (ref 65–99)
HEMATOCRIT: 40 % (ref 36.0–46.0)
HEMOGLOBIN: 13.6 g/dL (ref 12.0–15.0)
POTASSIUM: 3.7 mmol/L (ref 3.5–5.1)
Sodium: 136 mmol/L (ref 135–145)

## 2015-06-27 LAB — PREPARE RBC (CROSSMATCH)

## 2015-06-27 SURGERY — IMPLANTATION, AORTIC VALVE, TRANSCATHETER, FEMORAL APPROACH
Anesthesia: General | Site: Chest

## 2015-06-27 MED ORDER — SODIUM CHLORIDE 0.9 % IV SOLN
1.0000 mL/kg/h | INTRAVENOUS | Status: DC
  Administered 2015-06-27: 1 mL/kg/h via INTRAVENOUS

## 2015-06-27 MED ORDER — METOPROLOL TARTRATE 1 MG/ML IV SOLN
2.5000 mg | INTRAVENOUS | Status: DC | PRN
  Administered 2015-06-28: 2.5 mg via INTRAVENOUS
  Filled 2015-06-27: qty 5

## 2015-06-27 MED ORDER — ACETAMINOPHEN 650 MG RE SUPP: 650.0000 mg | Freq: Once | RECTAL | Status: DC

## 2015-06-27 MED ORDER — FENTANYL CITRATE (PF) 250 MCG/5ML IJ SOLN
INTRAMUSCULAR | Status: AC
  Filled 2015-06-27: qty 5

## 2015-06-27 MED ORDER — PROTAMINE SULFATE 10 MG/ML IV SOLN
INTRAVENOUS | Status: AC
  Filled 2015-06-27: qty 25

## 2015-06-27 MED ORDER — ALBUTEROL SULFATE HFA 108 (90 BASE) MCG/ACT IN AERS
INHALATION_SPRAY | RESPIRATORY_TRACT | Status: DC | PRN
  Administered 2015-06-27: 4 via RESPIRATORY_TRACT

## 2015-06-27 MED ORDER — MUPIROCIN 2 % EX OINT
1.0000 "application " | TOPICAL_OINTMENT | Freq: Two times a day (BID) | CUTANEOUS | Status: DC
  Administered 2015-06-27 – 2015-06-29 (×3): 1 via NASAL
  Filled 2015-06-27 (×2): qty 22

## 2015-06-27 MED ORDER — MORPHINE SULFATE (PF) 2 MG/ML IV SOLN: 2.0000 mg | INTRAVENOUS | Status: DC | PRN

## 2015-06-27 MED ORDER — ALBUMIN HUMAN 5 % IV SOLN
INTRAVENOUS | Status: DC | PRN
  Administered 2015-06-27: 11:00:00 via INTRAVENOUS

## 2015-06-27 MED ORDER — ASPIRIN 81 MG PO CHEW: 324.0000 mg | CHEWABLE_TABLET | Freq: Every day | ORAL | Status: DC

## 2015-06-27 MED ORDER — FENTANYL CITRATE (PF) 100 MCG/2ML IJ SOLN
INTRAMUSCULAR | Status: DC | PRN
  Administered 2015-06-27: 150 ug via INTRAVENOUS

## 2015-06-27 MED ORDER — DILTIAZEM HCL ER COATED BEADS 120 MG PO CP24
120.0000 mg | ORAL_CAPSULE | Freq: Every day | ORAL | Status: DC
  Administered 2015-06-27 – 2015-06-29 (×3): 120 mg via ORAL
  Filled 2015-06-27 (×3): qty 1

## 2015-06-27 MED ORDER — PROTAMINE SULFATE 10 MG/ML IV SOLN
INTRAVENOUS | Status: DC | PRN
  Administered 2015-06-27: 30 mg via INTRAVENOUS
  Administered 2015-06-27: 10 mg via INTRAVENOUS
  Administered 2015-06-27: 20 mg via INTRAVENOUS

## 2015-06-27 MED ORDER — LEVOTHYROXINE SODIUM 50 MCG PO TABS
50.0000 ug | ORAL_TABLET | ORAL | Status: DC
  Administered 2015-06-29: 50 ug via ORAL
  Filled 2015-06-27: qty 1

## 2015-06-27 MED ORDER — DEXTROSE 5 % IV SOLN
1.5000 g | Freq: Two times a day (BID) | INTRAVENOUS | Status: DC
  Administered 2015-06-27: 1.5 g via INTRAVENOUS
  Filled 2015-06-27 (×3): qty 1.5

## 2015-06-27 MED ORDER — SODIUM CHLORIDE 0.9 % IV SOLN
INTRAVENOUS | Status: DC | PRN
  Administered 2015-06-27: 1500 mL

## 2015-06-27 MED ORDER — ACETAMINOPHEN 160 MG/5ML PO SOLN: 1000.0000 mg | Freq: Four times a day (QID) | ORAL | Status: DC

## 2015-06-27 MED ORDER — VANCOMYCIN HCL IN DEXTROSE 1-5 GM/200ML-% IV SOLN
1000.0000 mg | Freq: Once | INTRAVENOUS | Status: AC
  Administered 2015-06-27: 1000 mg via INTRAVENOUS
  Filled 2015-06-27: qty 200

## 2015-06-27 MED ORDER — ACETAMINOPHEN 500 MG PO TABS
1000.0000 mg | ORAL_TABLET | Freq: Four times a day (QID) | ORAL | Status: DC
  Administered 2015-06-27 – 2015-06-28 (×2): 1000 mg via ORAL
  Filled 2015-06-27 (×3): qty 2

## 2015-06-27 MED ORDER — CHLORHEXIDINE GLUCONATE 0.12 % MT SOLN
15.0000 mL | OROMUCOSAL | Status: AC
  Administered 2015-06-27: 15 mL via OROMUCOSAL
  Filled 2015-06-27: qty 15

## 2015-06-27 MED ORDER — ARTIFICIAL TEARS OP OINT
TOPICAL_OINTMENT | OPHTHALMIC | Status: AC
  Filled 2015-06-27: qty 3.5

## 2015-06-27 MED ORDER — IODIXANOL 320 MG/ML IV SOLN
INTRAVENOUS | Status: DC | PRN
  Administered 2015-06-27: 85.7 mL via INTRAVENOUS

## 2015-06-27 MED ORDER — TRAMADOL HCL 50 MG PO TABS: 50.0000 mg | ORAL_TABLET | ORAL | Status: DC | PRN

## 2015-06-27 MED ORDER — VENLAFAXINE HCL ER 37.5 MG PO CP24
37.5000 mg | ORAL_CAPSULE | Freq: Every day | ORAL | Status: DC
  Administered 2015-06-28 – 2015-06-29 (×2): 37.5 mg via ORAL
  Filled 2015-06-27 (×3): qty 1

## 2015-06-27 MED ORDER — CHLORHEXIDINE GLUCONATE CLOTH 2 % EX PADS
6.0000 | MEDICATED_PAD | Freq: Every day | CUTANEOUS | Status: DC
  Administered 2015-06-27 – 2015-06-29 (×2): 6 via TOPICAL

## 2015-06-27 MED ORDER — DEXTROSE 5 % IV SOLN
10.0000 mg | INTRAVENOUS | Status: DC | PRN
  Administered 2015-06-27: 20 ug/min via INTRAVENOUS

## 2015-06-27 MED ORDER — EPHEDRINE SULFATE 50 MG/ML IJ SOLN
INTRAMUSCULAR | Status: DC | PRN
  Administered 2015-06-27: 5 mg via INTRAVENOUS

## 2015-06-27 MED ORDER — SODIUM CHLORIDE 0.9 % IV SOLN: 10.0000 mL/h | Freq: Once | INTRAVENOUS | Status: DC

## 2015-06-27 MED ORDER — DEXTROSE 5 % IV SOLN
4000.0000 ug | INTRAVENOUS | Status: DC | PRN
  Administered 2015-06-27: 2 ug/min via INTRAVENOUS

## 2015-06-27 MED ORDER — ASPIRIN EC 325 MG PO TBEC: 325.0000 mg | DELAYED_RELEASE_TABLET | Freq: Every day | ORAL | Status: DC

## 2015-06-27 MED ORDER — HEPARIN SODIUM (PORCINE) 1000 UNIT/ML IJ SOLN
INTRAMUSCULAR | Status: DC | PRN
  Administered 2015-06-27: 6000 [IU] via INTRAVENOUS

## 2015-06-27 MED ORDER — ROCURONIUM BROMIDE 100 MG/10ML IV SOLN
INTRAVENOUS | Status: DC | PRN
  Administered 2015-06-27: 50 mg via INTRAVENOUS

## 2015-06-27 MED ORDER — ROCURONIUM BROMIDE 50 MG/5ML IV SOLN
INTRAVENOUS | Status: AC
  Filled 2015-06-27: qty 1

## 2015-06-27 MED ORDER — FLUTICASONE PROPIONATE 50 MCG/ACT NA SUSP
2.0000 | Freq: Every day | NASAL | Status: DC | PRN
  Filled 2015-06-27: qty 16

## 2015-06-27 MED ORDER — PHENYLEPHRINE HCL 10 MG/ML IJ SOLN
0.0000 ug/min | INTRAVENOUS | Status: DC
  Filled 2015-06-27: qty 2

## 2015-06-27 MED ORDER — DEXMEDETOMIDINE HCL IN NACL 400 MCG/100ML IV SOLN
INTRAVENOUS | Status: DC | PRN
  Administered 2015-06-27: .3 ug/kg/h via INTRAVENOUS

## 2015-06-27 MED ORDER — LACTATED RINGERS IV SOLN
INTRAVENOUS | Status: DC | PRN
  Administered 2015-06-27 (×2): via INTRAVENOUS

## 2015-06-27 MED ORDER — MAGNESIUM SULFATE 4 GM/100ML IV SOLN
4.0000 g | Freq: Once | INTRAVENOUS | Status: DC
  Filled 2015-06-27: qty 100

## 2015-06-27 MED ORDER — ALBUMIN HUMAN 5 % IV SOLN: 250.0000 mL | INTRAVENOUS | Status: DC | PRN

## 2015-06-27 MED ORDER — ONDANSETRON HCL 4 MG/2ML IJ SOLN
INTRAMUSCULAR | Status: AC
  Filled 2015-06-27: qty 2

## 2015-06-27 MED ORDER — FAMOTIDINE IN NACL 20-0.9 MG/50ML-% IV SOLN
20.0000 mg | Freq: Two times a day (BID) | INTRAVENOUS | Status: DC
  Administered 2015-06-27: 20 mg via INTRAVENOUS

## 2015-06-27 MED ORDER — SUGAMMADEX SODIUM 200 MG/2ML IV SOLN
INTRAVENOUS | Status: DC | PRN
  Administered 2015-06-27: 200 mg via INTRAVENOUS

## 2015-06-27 MED ORDER — MIDAZOLAM HCL 2 MG/2ML IJ SOLN: 2.0000 mg | INTRAMUSCULAR | Status: DC | PRN

## 2015-06-27 MED ORDER — LACTATED RINGERS IV SOLN: 500.0000 mL | Freq: Once | INTRAVENOUS | Status: DC | PRN

## 2015-06-27 MED ORDER — POTASSIUM CHLORIDE 10 MEQ/50ML IV SOLN
10.0000 meq | INTRAVENOUS | Status: AC
  Administered 2015-06-27 (×3): 10 meq via INTRAVENOUS

## 2015-06-27 MED ORDER — ACETAMINOPHEN 160 MG/5ML PO SOLN: 650.0000 mg | Freq: Once | ORAL | Status: DC

## 2015-06-27 MED ORDER — LEVOTHYROXINE SODIUM 75 MCG PO TABS
75.0000 ug | ORAL_TABLET | ORAL | Status: DC
  Administered 2015-06-28: 75 ug via ORAL
  Filled 2015-06-27: qty 1

## 2015-06-27 MED ORDER — ONDANSETRON HCL 4 MG/2ML IJ SOLN: 4.0000 mg | Freq: Four times a day (QID) | INTRAMUSCULAR | Status: DC | PRN

## 2015-06-27 MED ORDER — NITROGLYCERIN IN D5W 200-5 MCG/ML-% IV SOLN
0.0000 ug/min | INTRAVENOUS | Status: DC
  Filled 2015-06-27: qty 250

## 2015-06-27 MED ORDER — OXYCODONE HCL 5 MG PO TABS: 5.0000 mg | ORAL_TABLET | ORAL | Status: DC | PRN

## 2015-06-27 MED ORDER — INSULIN REGULAR BOLUS VIA INFUSION
0.0000 [IU] | Freq: Three times a day (TID) | INTRAVENOUS | Status: DC
  Filled 2015-06-27: qty 10

## 2015-06-27 MED ORDER — LIDOCAINE HCL (CARDIAC) 20 MG/ML IV SOLN
INTRAVENOUS | Status: AC
  Filled 2015-06-27: qty 10

## 2015-06-27 MED ORDER — PROPOFOL 10 MG/ML IV BOLUS
INTRAVENOUS | Status: DC | PRN
  Administered 2015-06-27: 60 mg via INTRAVENOUS

## 2015-06-27 MED ORDER — CETYLPYRIDINIUM CHLORIDE 0.05 % MT LIQD
7.0000 mL | Freq: Two times a day (BID) | OROMUCOSAL | Status: DC
  Administered 2015-06-27 – 2015-06-29 (×3): 7 mL via OROMUCOSAL

## 2015-06-27 MED ORDER — SODIUM CHLORIDE 0.9 % IV SOLN
INTRAVENOUS | Status: DC
  Filled 2015-06-27: qty 2.5

## 2015-06-27 MED ORDER — PANTOPRAZOLE SODIUM 40 MG PO TBEC: 40.0000 mg | DELAYED_RELEASE_TABLET | Freq: Every day | ORAL | Status: DC

## 2015-06-27 MED ORDER — ONDANSETRON HCL 4 MG/2ML IJ SOLN
INTRAMUSCULAR | Status: DC | PRN
  Administered 2015-06-27: 4 mg via INTRAVENOUS

## 2015-06-27 MED ORDER — PROPOFOL 10 MG/ML IV BOLUS
INTRAVENOUS | Status: AC
  Filled 2015-06-27: qty 20

## 2015-06-27 MED FILL — Heparin Sodium (Porcine) Inj 1000 Unit/ML: INTRAMUSCULAR | Qty: 30 | Status: AC

## 2015-06-27 MED FILL — Potassium Chloride Inj 2 mEq/ML: INTRAVENOUS | Qty: 40 | Status: AC

## 2015-06-27 MED FILL — Magnesium Sulfate Inj 50%: INTRAMUSCULAR | Qty: 10 | Status: AC

## 2015-06-27 SURGICAL SUPPLY — 107 items
ADAPTER UNIV SWAN GANZ BIP (ADAPTER) IMPLANT
ADAPTER UNV SWAN GANZ BIP (ADAPTER) ×2
ADH SKN CLS APL DERMABOND .7 (GAUZE/BANDAGES/DRESSINGS) ×2
ADPR CATH UNV NS SG CATH (ADAPTER) ×2
ATTRACTOMAT 16X20 MAGNETIC DRP (DRAPES) IMPLANT
BAG BANDED W/RUBBER/TAPE 36X54 (MISCELLANEOUS) ×4 IMPLANT
BAG DECANTER FOR FLEXI CONT (MISCELLANEOUS) IMPLANT
BAG EQP BAND 135X91 W/RBR TAPE (MISCELLANEOUS) ×2
BAG SNAP BAND KOVER 36X36 (MISCELLANEOUS) ×8 IMPLANT
BLADE 10 SAFETY STRL DISP (BLADE) ×4 IMPLANT
BLADE STERNUM SYSTEM 6 (BLADE) ×4 IMPLANT
BLADE SURG ROTATE 9660 (MISCELLANEOUS) IMPLANT
CABLE PACING FASLOC BIEGE (MISCELLANEOUS) IMPLANT
CABLE PACING FASLOC BLUE (MISCELLANEOUS) ×4 IMPLANT
CANISTER SUCTION 2500CC (MISCELLANEOUS) IMPLANT
CANNULA FEM VENOUS REMOTE 22FR (CANNULA) IMPLANT
CANNULA OPTISITE PERFUSION 16F (CANNULA) IMPLANT
CANNULA OPTISITE PERFUSION 18F (CANNULA) IMPLANT
CATH DIAG EXPO 6F VENT PIG 145 (CATHETERS) ×4 IMPLANT
CATH EXPO 5FR AL1 (CATHETERS) ×2 IMPLANT
CATH S G BIP PACING (SET/KITS/TRAYS/PACK) ×6 IMPLANT
CLIP TI MEDIUM 24 (CLIP) ×4 IMPLANT
CLIP TI WIDE RED SMALL 24 (CLIP) ×4 IMPLANT
CONT SPEC STER OR (MISCELLANEOUS) ×6 IMPLANT
COVER DOME SNAP 22 D (MISCELLANEOUS) ×4 IMPLANT
COVER MAYO STAND STRL (DRAPES) ×4 IMPLANT
COVER TABLE BACK 60X90 (DRAPES) ×4 IMPLANT
CRADLE DONUT ADULT HEAD (MISCELLANEOUS) ×4 IMPLANT
DERMABOND ADVANCED (GAUZE/BANDAGES/DRESSINGS) ×2
DERMABOND ADVANCED .7 DNX12 (GAUZE/BANDAGES/DRESSINGS) ×2 IMPLANT
DEVICE CLOSURE PERCLS PRGLD 6F (VASCULAR PRODUCTS) IMPLANT
DRAPE INCISE IOBAN 66X45 STRL (DRAPES) IMPLANT
DRAPE PROXIMA HALF (DRAPES) ×4 IMPLANT
DRAPE SLUSH MACHINE 52X66 (DRAPES) ×4 IMPLANT
DRAPE TABLE COVER HEAVY DUTY (DRAPES) ×4 IMPLANT
DRSG TEGADERM 4X4.75 (GAUZE/BANDAGES/DRESSINGS) ×6 IMPLANT
ELECT REM PT RETURN 9FT ADLT (ELECTROSURGICAL) ×8
ELECTRODE REM PT RTRN 9FT ADLT (ELECTROSURGICAL) ×4 IMPLANT
FELT TEFLON 6X6 (MISCELLANEOUS) ×4 IMPLANT
FEMORAL VENOUS CANN RAP (CANNULA) IMPLANT
GAUZE SPONGE 4X4 12PLY STRL (GAUZE/BANDAGES/DRESSINGS) ×4 IMPLANT
GLOVE BIO SURGEON STRL SZ7.5 (GLOVE) ×4 IMPLANT
GLOVE BIO SURGEON STRL SZ8 (GLOVE) ×8 IMPLANT
GLOVE EUDERMIC 7 POWDERFREE (GLOVE) ×4 IMPLANT
GLOVE ORTHO TXT STRL SZ7.5 (GLOVE) ×4 IMPLANT
GOWN STRL REUS W/ TWL LRG LVL3 (GOWN DISPOSABLE) ×6 IMPLANT
GOWN STRL REUS W/ TWL XL LVL3 (GOWN DISPOSABLE) ×12 IMPLANT
GOWN STRL REUS W/TWL LRG LVL3 (GOWN DISPOSABLE) ×12
GOWN STRL REUS W/TWL XL LVL3 (GOWN DISPOSABLE) ×24
GUIDEWIRE SAF TJ AMPL .035X180 (WIRE) ×4 IMPLANT
GUIDEWIRE SAFE TJ AMPLATZ EXST (WIRE) ×2 IMPLANT
GUIDEWIRE STRAIGHT .035 260CM (WIRE) ×2 IMPLANT
INSERT FOGARTY 61MM (MISCELLANEOUS) ×4 IMPLANT
INSERT FOGARTY SM (MISCELLANEOUS) ×8 IMPLANT
INSERT FOGARTY XLG (MISCELLANEOUS) IMPLANT
KIT BASIN OR (CUSTOM PROCEDURE TRAY) ×4 IMPLANT
KIT DILATOR VASC 18G NDL (KITS) IMPLANT
KIT ROOM TURNOVER OR (KITS) ×4 IMPLANT
KIT SUCTION CATH 14FR (SUCTIONS) ×8 IMPLANT
NDL PERC 18GX7CM (NEEDLE) ×2 IMPLANT
NEEDLE PERC 18GX7CM (NEEDLE) ×4 IMPLANT
NS IRRIG 1000ML POUR BTL (IV SOLUTION) ×12 IMPLANT
PACK AORTA (CUSTOM PROCEDURE TRAY) ×4 IMPLANT
PAD ARMBOARD 7.5X6 YLW CONV (MISCELLANEOUS) ×8 IMPLANT
PAD ELECT DEFIB RADIOL ZOLL (MISCELLANEOUS) ×4 IMPLANT
PATCH TACHOSII LRG 9.5X4.8 (VASCULAR PRODUCTS) IMPLANT
PERCLOSE PROGLIDE 6F (VASCULAR PRODUCTS) ×8
SET MICROPUNCTURE 5F STIFF (MISCELLANEOUS) ×4 IMPLANT
SHEATH AVANTI 11CM 8FR (MISCELLANEOUS) ×2 IMPLANT
SHEATH PINNACLE 6F 10CM (SHEATH) ×2 IMPLANT
SHIELD RADPAD SCOOP 12X17 (MISCELLANEOUS) ×2 IMPLANT
SLEEVE REPOSITIONING LENGTH 30 (MISCELLANEOUS) ×2 IMPLANT
SPONGE GAUZE 4X4 12PLY STER LF (GAUZE/BANDAGES/DRESSINGS) ×4 IMPLANT
SPONGE LAP 4X18 X RAY DECT (DISPOSABLE) ×4 IMPLANT
STOPCOCK 4 WAY LG BORE MALE ST (IV SETS) ×2 IMPLANT
STOPCOCK MORSE 400PSI 3WAY (MISCELLANEOUS) ×6 IMPLANT
SUT ETHIBOND X763 2 0 SH 1 (SUTURE) ×4 IMPLANT
SUT GORETEX CV 4 TH 22 36 (SUTURE) ×4 IMPLANT
SUT GORETEX CV4 TH-18 (SUTURE) ×12 IMPLANT
SUT GORETEX TH-18 36 INCH (SUTURE) ×8 IMPLANT
SUT MNCRL AB 3-0 PS2 18 (SUTURE) ×4 IMPLANT
SUT PROLENE 3 0 SH1 36 (SUTURE) IMPLANT
SUT PROLENE 4 0 RB 1 (SUTURE) ×4
SUT PROLENE 4-0 RB1 .5 CRCL 36 (SUTURE) ×2 IMPLANT
SUT PROLENE 5 0 C 1 36 (SUTURE) ×8 IMPLANT
SUT PROLENE 6 0 C 1 30 (SUTURE) ×8 IMPLANT
SUT SILK  1 MH (SUTURE) ×2
SUT SILK 1 MH (SUTURE) ×2 IMPLANT
SUT SILK 2 0 SH CR/8 (SUTURE) IMPLANT
SUT VIC AB 2-0 CT1 27 (SUTURE) ×4
SUT VIC AB 2-0 CT1 TAPERPNT 27 (SUTURE) ×2 IMPLANT
SUT VIC AB 2-0 CTX 36 (SUTURE) IMPLANT
SUT VIC AB 3-0 SH 8-18 (SUTURE) ×8 IMPLANT
SYR 30ML LL (SYRINGE) ×12 IMPLANT
SYR 50ML LL SCALE MARK (SYRINGE) ×4 IMPLANT
TAPE STRIPS DRAPE STRL (GAUZE/BANDAGES/DRESSINGS) ×2 IMPLANT
TOWEL OR 17X26 10 PK STRL BLUE (TOWEL DISPOSABLE) ×8 IMPLANT
TRANSDUCER W/STOPCOCK (MISCELLANEOUS) ×6 IMPLANT
TRAY FOLEY IC TEMP SENS 14FR (CATHETERS) ×4 IMPLANT
TUBE SUCT INTRACARD DLP 20F (MISCELLANEOUS) IMPLANT
TUBING ART PRESS 72  MALE/FEM (TUBING) ×2
TUBING ART PRESS 72 MALE/FEM (TUBING) IMPLANT
TUBING HIGH PRESSURE 120CM (CONNECTOR) ×4 IMPLANT
VALVE HEART TRANSCATH SZ3 23MM (Prosthesis & Implant Heart) ×2 IMPLANT
WIRE .035 3MM-J 145CM (WIRE) ×2 IMPLANT
WIRE AMPLATZ SS-J .035X180CM (WIRE) ×2 IMPLANT
WIRE BENTSON .035X145CM (WIRE) ×2 IMPLANT

## 2015-06-27 NOTE — Progress Notes (Signed)
UR Completed. Elyn Krogh, RN, BSN.  [phone] 

## 2015-06-27 NOTE — Care Management Note (Signed)
Case Management Note  Patient Details  Name: ARWILDA ZELMER MRN: [id number] Date of Birth: 1930/05/24  Subjective/Objective:       Pt is s/p TAVR             Action/Plan:  Pt is from home with husband, pt mobility at home is restricted due to sever degenerative disease.  PT will be requested prior to discharge.  CM will continue to monitor   Expected Discharge Date:                  Expected Discharge Plan:  Home/Self Care  In-House Referral:     Discharge planning Services  CM Consult  Post Acute Care Choice:    Choice offered to:     DME Arranged:    DME Agency:     HH Arranged:    HH Agency:     Status of Service:  In process, will continue to follow  Medicare Important Message Given:    Date Medicare IM Given:    Medicare IM give by:    Date Additional Medicare IM Given:    Additional Medicare Important Message give by:     If discussed at [place] of Stay Meetings, dates discussed:    Additional Comments:  Maryclare Labrador, RN 06/27/2015, 2:05 PM

## 2015-06-27 NOTE — Progress Notes (Signed)
Dr. Burt Knack at bedside, made aware of repeat ABG, no interventions at this time, would like repeat in two hours, MD would like to manage bp with cuff pressures instead of arterial line pressures, will like to keep MAP <100. Sherrie Mustache 4:41 PM

## 2015-06-27 NOTE — Interval H&P Note (Signed)
History and Physical Interval Note:  06/27/2015 5:41 AM  Cecile Hearing  has presented today for surgery, with the diagnosis of SEVERE AS  The various methods of treatment have been discussed with the patient and family. After consideration of risks, benefits and other options for treatment, the patient has consented to  Procedure(s): TRANSCATHETER AORTIC VALVE REPLACEMENT, TRANSFEMORAL (N/A) TRANSESOPHAGEAL ECHOCARDIOGRAM (TEE) (N/A) as a surgical intervention .  The patient's history has been reviewed, patient examined, no change in status, stable for surgery.  I have reviewed the patient's chart and labs.  Questions were answered to the patient's satisfaction.     Gaye Pollack

## 2015-06-27 NOTE — Interval H&P Note (Signed)
History and Physical Interval Note:  06/27/2015 9:56 AM  Cecile Hearing  has presented today for surgery, with the diagnosis of SEVERE AS  The various methods of treatment have been discussed with the patient and family. After consideration of risks, benefits and other options for treatment, the patient has consented to  Procedure(s): TRANSCATHETER AORTIC VALVE REPLACEMENT, TRANSFEMORAL (N/A) TRANSESOPHAGEAL ECHOCARDIOGRAM (TEE) (N/A) as a surgical intervention .  The patient's history has been reviewed, patient examined, no change in status, stable for surgery.  I have reviewed the patient's chart and labs.  Questions were answered to the patient's satisfaction.     Gaye Pollack

## 2015-06-27 NOTE — Progress Notes (Signed)
Patient ID: Tara Mack, female   DOB: 1931-01-05, 82 y.o.   MRN: [id number]  SICU Evening Rounds:  Hemodynamically stable in sinus rhythm  Alert, eating dinner  Left groin site looks ok. Minimal drainage on bandage at this time. Had to be changed earlier.

## 2015-06-27 NOTE — Op Note (Signed)
[facility]   MULTIDISCIPLINARY HEART VALVE TEAM   TAVR OPERATIVE NOTE Date of Procedure:  06/27/2015  Preoperative Diagnosis: Severe Aortic Stenosis   Postoperative Diagnosis: Same   Procedure:   Transcatheter Aortic Valve Replacement - Percutaneous Transfemoral Approach  Edwards Sapien 3 THV (size 23 mm, model # 9600TFX, serial # [id number])   Co-Surgeons:  Gaye Pollack, MD and Sherren Mocha, MD  Anesthesiologist:  Dr Glennon Mac  Echocardiographer:  Dr Johnsie Cancel  Pre-operative Echo Findings:  Severe aortic stenosis  Normal left ventricular systolic function  Moderate MR  Post-operative Echo Findings:  No paravalvular leak  Normal left ventricular systolic function  MR unchanged  BRIEF CLINICAL NOTE AND INDICATIONS FOR SURGERY  Patient is an 82 year old female with history of aortic stenosis, chronic persistent atrial fibrillation, hypertension, hypothyroidism, depression, and chronic fatigue who has been referred for TAVR for treatment of severe symptomatic aortic stenosis. The patient's cardiac history dates back many years ago when she developed paroxysmal atrial fibrillation. This initially occurred in the setting of hyperthyroidism caused by excessively high doses of thyroid replacement therapy for hypothyroidism. Her atrial fibrillation resolved. Approximately 2 years ago the patient was noted to be in atrial fibrillation by her primary care physician. She was referred to Dr. Stanford Breed who has been following her regularly ever since. An echocardiogram performed in January 2015 revealed normal left ventricular systolic function with moderate to severe left atrial enlargement and mild aortic stenosis with mean transvalvular gradient estimated 18 mmHg. The patient's aortic stenosis has been followed with serial echocardiograms. Echocardiogram performed in August 2016 revealed normal left ventricular systolic function with severe aortic stenosis and mean  transvalvular gradient across the aortic valve estimated 43 mmHg. Over the past year or so the patient has complained of worsening symptoms of fatigue and exertional shortness of breath. Repeat echocardiogram performed 04/21/2015 revealed further progression of the severity aortic stenosis with peak velocity across the aortic valve measured as high as 4.5 m per sec and corresponding to mean transvalvular gradient estimated 54 mmHg. Left ventricular systolic function remains normal with ejection fraction estimated 55%. The patient was referred to Dr. Burt Knack and underwent diagnostic cardiac catheterization on 05/15/2015. She was found to have severe aortic stenosis with Peak to peak and mean transvalvular gradients measured 37 and 25 mmHg respectively. Aortic valve area was calculated 0.8 cm. The patient was found to have mild nonobstructive coronary artery disease And moderate pulmonary hypertension. The patient was referred for surgical consultation and evaluated recently by Dr. Cyndia Bent. She was felt to be of at least moderate risk for conventional surgical aortic valve replacement and transcatheter aortic valve replacement was discussed as a less invasive alternative. The patient has undergone CT angiography and has suitable anatomy for transfemoral TAVR. She presents today for TAVR surgery.   During the course of the patient's preoperative work up they have been evaluated comprehensively by a multidisciplinary team of specialists coordinated through the [facility] in the [facility].  They have been demonstrated to suffer from symptomatic severe aortic stenosis as noted above. The patient has been counseled extensively as to the relative risks and benefits of all options for the treatment of severe aortic stenosis including long term medical therapy, conventional surgery for aortic valve replacement, and transcatheter aortic valve replacement.  The patient  has been independently evaluated by two cardiac surgeons including Dr. Roxy Manns and Dr. Cyndia Bent, and they are felt to be at high risk for conventional surgical aortic  valve replacement based upon a predicted risk of mortality using the Society of Thor[employer] Surgeons risk calculator of 2.8%. Both surgeons indicated the patient would be a poor candidate for conventional surgery because of comorbidities including advanced age and limited mobility/reduced functional capacity because of advanced osteoarthritis.   Based upon review of all of the patient's preoperative diagnostic tests they are felt to be candidate for transcatheter aortic valve replacement using the transfemoral approach as an alternative to high risk conventional surgery.    Following the decision to proceed with transcatheter aortic valve replacement, a discussion has been held regarding what types of management strategies would be attempted intraoperatively in the event of life-threatening complications, including whether or not the patient would be considered a candidate for the use of cardiopulmonary bypass and/or conversion to open sternotomy for attempted surgical intervention.  The patient has been advised of a variety of complications that might develop peculiar to this approach including but not limited to risks of death, stroke, paravalvular leak, aortic dissection or other major vascular complications, aortic annulus rupture, device embolization, cardiac rupture or perforation, acute myocardial infarction, arrhythmia, heart block or bradycardia requiring permanent pacemaker placement, congestive heart failure, respiratory failure, renal failure, pneumonia, infection, other late complications related to structural valve deterioration or migration, or other complications that might ultimately cause a temporary or permanent loss of functional independence or other long term morbidity.  The patient provides full informed consent for the procedure as  described and all questions were answered preoperatively.    DETAILS OF THE OPERATIVE PROCEDURE  PREPARATION:    The patient is brought to the operating room on the above mentioned date and central monitoring was established by the anesthesia team including placement of Swan-Ganz catheter and radial arterial line. The patient is placed in the supine position on the operating table.  Intravenous antibiotics are administered. General endotracheal anesthesia is induced uneventfully.  A Foley catheter is placed.  Baseline transesophageal echocardiogram was performed. The patient's chest, abdomen, both groins, and both lower extremities are prepared and draped in a sterile manner. A time out procedure is performed.   PERIPHERAL ACCESS:    Using the modified Seldinger technique, femoral arterial and venous access was obtained with placement of 6 Fr sheaths on the right side.  A pigtail diagnostic catheter was passed through the left femoral arterial sheath under fluoroscopic guidance into the aortic root.  A temporary transvenous pacemaker catheter was passed through the right femoral venous sheath under fluoroscopic guidance into the right ventricle.  The pacemaker was tested to ensure stable lead placement and pacemaker capture. Aortic root angiography was performed in order to determine the optimal angiographic angle for valve deployment.   TRANSFEMORAL ACCESS:  A micropuncture technique is used to access the left femoral artery under fluoroscopic guidance.  Femoral angiography is performed to verify access in the common femoral artery. 2 Perclose devices are deployed at 10' and 2' positions to 'PreClose' the femoral artery. An 8 French sheath is placed and then an Amplatz Superstiff wire is advanced through the sheath. This is changed out for a 14 French transfemoral E-Sheath after progressively dilating over the Superstiff wire.  An AL-1 catheter was used to direct a straight-tip exchange length  wire across the native aortic valve into the left ventricle. This was exchanged out for a pigtail catheter and position was confirmed in the LV apex. Simultaneous LV and Ao pressures were recorded.  The pigtail catheter was exchanged for an Amplatz Extra-stiff wire in the  LV apex.  Echocardiography was utilized to confirm appropriate wire position and no sign of entanglement in the mitral subvalvular apparatus.   BALLOON AORTIC VALVULOPLASTY:  Balloon aortic valvuloplasty was performed using a 20 mm valvuloplasty balloon.  Once optimal position was achieved, BAV was done under rapid ventricular pacing. The patient recovered well hemodynamically.    TRANSCATHETER HEART VALVE DEPLOYMENT:  An Edwards Sapien 3 transcatheter heart valve (size 23 mm, model #9600TFX, serial # [id number]) was prepared and crimped per manufacturer's guidelines, and the proper orientation of the valve is confirmed on the [employer] delivery system. The valve was advanced through the introducer sheath using normal technique until in an appropriate position in the abdominal aorta beyond the sheath tip. The balloon was then retracted and using the fine-tuning wheel was centered on the valve. The valve was then advanced across the aortic arch using appropriate flexion of the catheter. The valve was carefully positioned across the aortic valve annulus. The Commander catheter was retracted using normal technique. Once final position of the valve has been confirmed by angiographic assessment, the valve is deployed while temporarily holding ventilation and during rapid ventricular pacing to maintain systolic blood pressure < 50 mmHg and pulse pressure < 10 mmHg. The balloon inflation is held for >3 seconds after reaching full deployment volume. Once the balloon has fully deflated the balloon is retracted into the ascending aorta and valve function is assessed using echocardiography. There is felt to be no paravalvular leak and no central  aortic insufficiency.  The patient's hemodynamic recovery following valve deployment is good.  The deployment balloon and guidewire are both removed. Echo demostrated acceptable post-procedural gradients, stable mitral valve function, and no aortic insufficiency.   PROCEDURE COMPLETION:  The sheath was removed and femoral artery closure is performed using the 2 previously deployed Perclose devices.  Protamine was administered once femoral arterial repair was complete. The temporary pacemaker, pigtail catheters and femoral sheaths were removed with manual pressure used for hemostasis.   The patient tolerated the procedure well and is transported to the surgical intensive care in stable condition. There were no immediate intraoperative complications. All sponge instrument and needle counts are verified correct at completion of the operation.   The patient received a total of 86 mL of intravenous contrast during the procedure.  Sherren Mocha, MD 06/27/2015 11:02 AM

## 2015-06-27 NOTE — Transfer of Care (Signed)
Immediate Anesthesia Transfer of Care Note  Patient: Tara Mack  Procedure(s) Performed: Procedure(s): TRANSCATHETER AORTIC VALVE REPLACEMENT, TRANSFEMORAL (N/A) TRANSESOPHAGEAL ECHOCARDIOGRAM (TEE) (N/A)  Patient Location: SICU  Anesthesia Type:General  Level of Consciousness: awake, alert  and oriented  Airway & Oxygen Therapy: Patient Spontanous Breathing and Patient connected to nasal cannula oxygen  Post-op Assessment: Report given to RN, Post -op Vital signs reviewed and stable and Patient moving all extremities X 4  Post vital signs: Reviewed and stable  Last Vitals:  Filed Vitals:   06/27/15 0846  BP: 165/113  Pulse: 110  Temp: [id number] C    Complications: No apparent anesthesia complications

## 2015-06-27 NOTE — Progress Notes (Signed)
Informed pt of positive Staph PCR screening collected 06/23/15 prior to surgery, pt was unaware and confirmed that she has not had the 5 day course of Bactroban. Positive Staph PCR order set initiated.

## 2015-06-27 NOTE — Progress Notes (Signed)
  Echocardiogram Echocardiogram Transesophageal has been performed.  Diamond Nickel 06/27/2015, 12:48 PM

## 2015-06-27 NOTE — Anesthesia Preprocedure Evaluation (Addendum)
Anesthesia Evaluation   Patient awake    Reviewed: Allergy & Precautions, NPO status , Patient's Chart, lab work & pertinent test results  History of Anesthesia Complications Negative for: history of anesthetic complications  Airway Mallampati: II  TM Distance: >3 FB Neck ROM: Full    Dental  (+) Chipped, Dental Advisory Given   Pulmonary shortness of breath,    breath sounds clear to auscultation       Cardiovascular hypertension, Pt. on medications (-) angina+ CAD (mild, non-obstructive)  + dysrhythmias Atrial Fibrillation  Rhythm:Irregular Rate:Normal + Systolic murmurs ECHO: EF 55-60%, severe AS with mean grad 54 mmHg, mod MR   Neuro/Psych Depression negative neurological ROS     GI/Hepatic negative GI ROS, Neg liver ROS,   Endo/Other  Hypothyroidism Morbid obesity  Renal/GU negative Renal ROS     Musculoskeletal  (+) Arthritis ,   Abdominal (+) + obese,   Peds  Hematology eliquis   Anesthesia Other Findings   Reproductive/Obstetrics                            Anesthesia Physical Anesthesia Plan  ASA: IV  Anesthesia Plan: General   Post-op Pain Management:    Induction: Intravenous  Airway Management Planned: Oral ETT  Additional Equipment: Arterial line, CVP, Ultrasound Guidance Line Placement and TEE  Intra-op Plan:   Post-operative Plan: Possible Post-op intubation/ventilation  Informed Consent: I have reviewed the patients History and Physical, chart, labs and discussed the procedure including the risks, benefits and alternatives for the proposed anesthesia with the patient or authorized representative who has indicated his/her understanding and acceptance.   Dental advisory given  Plan Discussed with: CRNA and Surgeon  Anesthesia Plan Comments: (Plan routine monitors, A-line, central access, GETA with TEE and possible post op ventilation)        Anesthesia  Quick Evaluation

## 2015-06-27 NOTE — Op Note (Signed)
[facility]   MULTIDISCIPLINARY HEART VALVE TEAM   TAVR OPERATIVE NOTE   Date of Procedure:  06/27/2015  Preoperative Diagnosis: Severe Aortic Stenosis   Postoperative Diagnosis: Same   Procedure:    Transcatheter Aortic Valve Replacement - Percutaneous Left Transfemoral Approach  Edwards Sapien 3 THV (size 23 mm, model # 9600TFX, serial # [id number])   Co-Surgeons:  Gaye Pollack, MD and Sherren Mocha, MD  Assistants:   none  Anesthesiologist:  Dr. Annye Asa  Echocardiographer:  Dr. Jenkins Rouge  Pre-operative Echo Findings:   severe aortic stenosis   normal left ventricular systolic function   moderate MR  Post-operative Echo Findings:  trivial paravalvular leak  normal left ventricular systolic function  Unchanged moderate MR  BRIEF CLINICAL NOTE AND INDICATIONS FOR SURGERY  The patient has stage D severe, symptomatic aortic stenosis with NYHA class II symptoms of exertional shortness of breath and fatigue that has been progressing. I have personally reviewed her echo and cath. Her aortic valve is trileaflet with calcified leaflets with severe restricted mobility and a mean gradient of 54 mm Hg. I agree with Dr. Stanford Breed that her aortic valve should be replaced with severe stenosis and progressive symptoms. She is in the intermediate risk class at 82 years old with persistent atrial fibrillation and severe degenerative arthritis of her knees with limited mobility. She would have a slow recovery with surgical aortic valve replacement. I think TAVR would be a reasonable option for her. I have personally reviewed her cardiac CT and abdominal/pelvic CT. These show that she is a candidate for transfemoral TAVR with a Sapien 3 valve.   Following the decision to proceed with transcatheter aortic valve replacement, a discussion has been held regarding what types of management strategies would be attempted intraoperatively in the event of  life-threatening complications, including whether or not the patient would be considered a candidate for the use of cardiopulmonary bypass and/or conversion to open sternotomy for attempted surgical intervention.  The patient has been advised of a variety of complications that might develop peculiar to this approach including but not limited to risks of death, stroke, paravalvular leak, aortic dissection or other major vascular complications, aortic annulus rupture, device embolization, cardiac rupture or perforation, acute myocardial infarction, arrhythmia, heart block or bradycardia requiring permanent pacemaker placement, congestive heart failure, respiratory failure, renal failure, pneumonia, infection, other late complications related to structural valve deterioration or migration, or other complications that might ultimately cause a temporary or permanent loss of functional independence or other long term morbidity.  The patient provides full informed consent for the procedure as described and all questions were answered preoperatively.    DETAILS OF THE OPERATIVE PROCEDURE  PREPARATION:    The patient is brought to the operating room on the above mentioned date and central monitoring was established by the anesthesia team including placement of a sheath in the right neck and a radial arterial line. The patient is placed in the supine position on the operating table.  Intravenous antibiotics are administered. General endotracheal anesthesia is induced uneventfully.  A Foley catheter is placed.  Baseline transesophageal echocardiogram was performed. The patient's chest, abdomen, both groins, and both lower extremities are prepared and draped in a sterile manner. A time out procedure is performed.   Peripheral and transfemoral access were performed by Dr. Burt Knack and will be dictated in his operative note.      BALLOON AORTIC VALVULOPLASTY:   Balloon aortic valvuloplasty was performed using a  20  mm valvuloplasty balloon.  Once optimal position was achieved, BAV was done under rapid ventricular pacing. The patient recovered well hemodynamically.    TRANSCATHETER HEART VALVE DEPLOYMENT:   An Edwards Sapien 3 transcatheter heart valve (size 29 mm, model #9600TFX, serial [id number]) was prepared and crimped per manufacturer's guidelines, and the proper orientation of the valve is confirmed on the [employer] delivery system. The valve was advanced through the introducer sheath using normal technique until in an appropriate position in the abdominal aorta beyond the sheath tip. The balloon was then retracted and using the fine-tuning wheel was centered on the valve. The valve was then advanced across the aortic arch using appropriate flexion of the catheter. The valve was carefully positioned across the aortic valve annulus. The Commander catheter was retracted using normal technique. Once final position of the valve has been confirmed by angiographic assessment, the valve is deployed while temporarily holding ventilation and during rapid ventricular pacing to maintain systolic blood pressure < 50 mmHg and pulse pressure < 10 mmHg. The balloon inflation is held for >3 seconds after reaching full deployment volume. Once the balloon has fully deflated the balloon is retracted into the ascending aorta and valve function is assessed using echocardiography. There is felt to be trivial paravalvular leak and no central aortic insufficiency.  The patient's hemodynamic recovery following valve deployment is good.  The deployment balloon and guidewire are both removed. Echo demostrated acceptable post-procedural gradients, stable mitral valve function, and no aortic insufficiency.   PROCEDURE COMPLETION:   The sheath was removed and femoral artery closure performed by Dr Burt Knack. Please see his separate report for details.  Protamine was administered once femoral arterial repair was complete. The temporary  pacemaker, pigtail catheters and femoral sheaths were removed with manual pressure used for hemostasis.   The patient tolerated the procedure well and is transported to the surgical intensive care in stable condition. There were no immediate intraoperative complications. All sponge instrument and needle counts are verified correct at completion of the operation.   No blood products were administered during the operation.  The patient received a total of 85.7 mL of intravenous contrast during the procedure.   Gaye Pollack, MD 06/27/2015 1:26 PM

## 2015-06-27 NOTE — Brief Op Note (Signed)
06/27/2015  1:05 PM  PATIENT:  Cecile Hearing  82 y.o. female  PRE-OPERATIVE DIAGNOSIS:  SEVERE AS  POST-OPERATIVE DIAGNOSIS:  Severe Aortic stenosis  PROCEDURE:  Procedure(s): TRANSCATHETER AORTIC VALVE REPLACEMENT, TRANSFEMORAL (N/A) TRANSESOPHAGEAL ECHOCARDIOGRAM (TEE) (N/A)  SURGEON:  Surgeon(s) and Role:    * Sherren Mocha, MD - Primary    * Gaye Pollack, MD - Co-Surgeon         PHYSICIAN ASSISTANT: none    ANESTHESIA:   general  EBL:  Total I/O In: 1250 [I.V.:1000; IV Piggyback:250] Out: 175 [Urine:175]  BLOOD ADMINISTERED:none  DRAINS: none   LOCAL MEDICATIONS USED:  NONE  SPECIMEN:  No Specimen  DISPOSITION OF SPECIMEN:  N/A  COUNTS:  YES  TOURNIQUET:  * No tourniquets in log *  DICTATION: .Note written in EPIC  PLAN OF CARE: Admit to inpatient   PATIENT DISPOSITION:  ICU - extubated and stable.   Delay start of Pharmacological VTE agent (>24hrs) due to surgical blood loss or risk of bleeding: yes

## 2015-06-27 NOTE — Anesthesia Procedure Notes (Addendum)
Central Venous Catheter Insertion Performed by: anesthesiologist 06/27/2015 10:35 AM Patient location: Pre-op. Preanesthetic checklist: patient identified, IV checked, risks and benefits discussed, surgical consent, monitors and equipment checked, pre-op evaluation and timeout performed Position: supine Lidocaine 1% used for infiltration Landmarks identified and Seldinger technique used Catheter size: 8.5 Fr Central line was placed.Sheath introducer Procedure performed using ultrasound guided technique. Attempts: 1 Following insertion, line sutured, dressing applied and Biopatch. Post procedure assessment: blood return through all ports, free fluid flow and no air. Patient tolerated the procedure well with no immediate complications. Additional procedure comments: CVP: Timeout, sterile prep, drape, FBP R neck.  Trendelenburg position.  1% lido local, finder and trocar RIJ 1st pass with US guidance.  Cordis placed over J wire. Biopatch and sterile dressing on.  Patient tolerated well.  VSS.  Jenita Seashore, MD.    Procedure Name: Intubation Date/Time: 06/27/2015 10:58 AM Performed by: Suzy Bouchard Pre-anesthesia Checklist: Emergency Drugs available, Patient identified, Suction available, Patient being monitored and Timeout performed Patient Re-evaluated:Patient Re-evaluated prior to inductionOxygen Delivery Method: Circle system utilized Preoxygenation: Pre-oxygenation with 100% oxygen Intubation Type: IV induction Ventilation: Mask ventilation without difficulty and Oral airway inserted - appropriate to patient size Laryngoscope Size: Mac and 3 Grade View: Grade III Tube type: Oral Number of attempts: 2 Placement Confirmation: ETT inserted through vocal cords under direct vision,  positive ETCO2 and breath sounds checked- equal and bilateral Tube secured with: Tape Dental Injury: Injury to lip  Difficulty Due To: Difficult Airway- due to anterior larynx and Difficult Airway- due to  limited oral opening Future Recommendations: Recommend- induction with short-acting agent, and alternative techniques readily available

## 2015-06-28 ENCOUNTER — Encounter (HOSPITAL_COMMUNITY): Payer: Self-pay | Admitting: Cardiovascular Disease

## 2015-06-28 ENCOUNTER — Inpatient Hospital Stay (HOSPITAL_COMMUNITY): Payer: Medicare Other

## 2015-06-28 ENCOUNTER — Ambulatory Visit (HOSPITAL_COMMUNITY): Payer: Medicare Other

## 2015-06-28 DIAGNOSIS — I340 Nonrheumatic mitral (valve) insufficiency: Secondary | ICD-10-CM

## 2015-06-28 DIAGNOSIS — Z954 Presence of other heart-valve replacement: Secondary | ICD-10-CM

## 2015-06-28 DIAGNOSIS — G459 Transient cerebral ischemic attack, unspecified: Secondary | ICD-10-CM

## 2015-06-28 LAB — BASIC METABOLIC PANEL
Anion gap: 8 (ref 5–15)
BUN: 7 mg/dL (ref 6–20)
CALCIUM: 8.8 mg/dL — AB (ref 8.9–10.3)
CHLORIDE: 102 mmol/L (ref 101–111)
CO2: 28 mmol/L (ref 22–32)
CREATININE: 0.6 mg/dL (ref 0.44–1.00)
GFR calc non Af Amer: >60 mL/min (ref >60)
Glucose, Bld: 112 mg/dL — ABNORMAL HIGH (ref 65–99)
Potassium: 4.1 mmol/L (ref 3.5–5.1)
SODIUM: 138 mmol/L (ref 135–145)

## 2015-06-28 LAB — CBC
HCT: 37.2 % (ref 36.0–46.0)
HEMOGLOBIN: 11.6 g/dL — AB (ref 12.0–15.0)
MCH: 32.7 pg (ref 26.0–34.0)
MCHC: 31.2 g/dL (ref 30.0–36.0)
MCV: 104.8 fL — ABNORMAL HIGH (ref 78.0–100.0)
PLATELETS: 126 10*3/uL — AB (ref 150–400)
RBC: 3.55 MIL/uL — AB (ref 3.87–5.11)
RDW: 14.2 % (ref 11.5–15.5)
WBC: 7.4 10*3/uL (ref 4.0–10.5)

## 2015-06-28 LAB — MAGNESIUM: MAGNESIUM: 1.8 mg/dL (ref 1.7–2.4)

## 2015-06-28 MED ORDER — APIXABAN 5 MG PO TABS
5.0000 mg | ORAL_TABLET | Freq: Two times a day (BID) | ORAL | Status: DC
  Administered 2015-06-28 – 2015-06-29 (×2): 5 mg via ORAL
  Filled 2015-06-28 (×3): qty 1

## 2015-06-28 MED ORDER — ONDANSETRON HCL 4 MG PO TABS: 4.0000 mg | ORAL_TABLET | Freq: Four times a day (QID) | ORAL | Status: DC | PRN

## 2015-06-28 MED ORDER — SODIUM CHLORIDE 0.9 % IV SOLN: 250.0000 mL | INTRAVENOUS | Status: DC | PRN

## 2015-06-28 MED ORDER — LOSARTAN POTASSIUM 50 MG PO TABS
50.0000 mg | ORAL_TABLET | Freq: Every day | ORAL | Status: DC
  Administered 2015-06-28 – 2015-06-29 (×2): 50 mg via ORAL
  Filled 2015-06-28 (×2): qty 1

## 2015-06-28 MED ORDER — BISACODYL 5 MG PO TBEC: 10.0000 mg | DELAYED_RELEASE_TABLET | Freq: Every day | ORAL | Status: DC | PRN

## 2015-06-28 MED ORDER — SODIUM CHLORIDE 0.9% FLUSH
3.0000 mL | Freq: Two times a day (BID) | INTRAVENOUS | Status: DC
  Administered 2015-06-28 – 2015-06-29 (×2): 3 mL via INTRAVENOUS

## 2015-06-28 MED ORDER — DOCUSATE SODIUM 100 MG PO CAPS
200.0000 mg | ORAL_CAPSULE | Freq: Every day | ORAL | Status: DC
  Administered 2015-06-28 – 2015-06-29 (×2): 200 mg via ORAL
  Filled 2015-06-28 (×2): qty 2

## 2015-06-28 MED ORDER — ONDANSETRON HCL 4 MG/2ML IJ SOLN: 4.0000 mg | Freq: Four times a day (QID) | INTRAMUSCULAR | Status: DC | PRN

## 2015-06-28 MED ORDER — BISACODYL 10 MG RE SUPP: 10.0000 mg | Freq: Every day | RECTAL | Status: DC | PRN

## 2015-06-28 MED ORDER — ACETAMINOPHEN 325 MG PO TABS: 650.0000 mg | ORAL_TABLET | Freq: Four times a day (QID) | ORAL | Status: DC | PRN

## 2015-06-28 MED ORDER — TRAMADOL HCL 50 MG PO TABS: 50.0000 mg | ORAL_TABLET | ORAL | Status: DC | PRN

## 2015-06-28 MED ORDER — FAMOTIDINE 20 MG PO TABS
20.0000 mg | ORAL_TABLET | Freq: Two times a day (BID) | ORAL | Status: DC
  Administered 2015-06-28 – 2015-06-29 (×3): 20 mg via ORAL
  Filled 2015-06-28 (×3): qty 1

## 2015-06-28 MED ORDER — SODIUM CHLORIDE 0.9% FLUSH
3.0000 mL | INTRAVENOUS | Status: DC | PRN
  Administered 2015-06-28: 3 mL via INTRAVENOUS
  Filled 2015-06-28: qty 3

## 2015-06-28 NOTE — Progress Notes (Signed)
    Subjective:  Doing well with the exception of poor sleep last night. No chest pain or shortness of breath.  Objective:  Vital Signs in the last 24 hours: Temp:  [97.5 F (36.4 C)-99.6 F (37.6 C)] 99.6 F (37.6 C) (02/22 0840) Pulse Rate:  [48-114] 85 (02/22 0700) Resp:  [0-31] 11 (02/22 0700) BP: (98-149)/(60-100) 135/76 mmHg (02/22 0700) SpO2:  [83 %-100 %] 97 % (02/22 0700) Arterial Line BP: (130-191)/(56-89) 133/65 mmHg (02/22 0500) FiO2 (%):  [50 %] 50 % (02/22 0600) Weight:  [80.1 kg (176 lb 9.4 oz)] 80.1 kg (176 lb 9.4 oz) (02/22 0500)  Intake/Output from previous day: 02/21 0701 - 02/22 0700 In: 3078.1 [P.O.:240; I.V.:2138.1; IV Piggyback:700] Out: 69 [Urine:1900]  Physical Exam: Pt is alert and oriented, NAD, sitting up in chair HEENT: normal Neck: JVP - normal Lungs: CTA bilaterally CV: Irregularly irregular with soft systolic murmur best heard at the left sternal border Abd: soft, NT, Positive BS, no hepatomegaly Ext: no C/C/E, distal pulses intact and equal, bilateral groin sites are clear Skin: warm/dry no rash   Lab Results:  Recent Labs  06/27/15 1947 06/28/15 0340  WBC 8.1 7.4  HGB 11.4* 11.6*  PLT 140* 126*    Recent Labs  06/27/15 1932 06/27/15 1947 06/28/15 0340  NA 137  --  138  K 4.4  --  4.1  CL 97*  --  102  CO2  --   --  28  GLUCOSE 110*  --  112*  BUN 14  --  7  CREATININE 0.50 0.50 0.60   No results for input(s): TROPONINI in the last 72 hours.  Invalid input(s): CK, MB  Cardiac Studies: 2-D echocardiogram (postoperative day #1 study) is pending  Tele: Personally reviewed: Atrial fibrillation with controlled ventricular response  Assessment/Plan:  1. Severe symptomatic aortic stenosis postoperative day #1 from percutaneous transfemoral TAVR: The patient is progressing well. She will be transferred to a telemetry bed today. She will be mobilized with discontinuation of her Foley catheter and right internal jugular  line.  2. Chronic atrial fibrillation: Resume Eliquis this evening. Heart rate controlled on diltiazem.   Otherwise as per Dr Cyndia Bent. Pt progressing well after TAVR - anticipate hospital discharge tomorrow pending echo review    Sherren Mocha, M.D. 06/28/2015, 9:50 AM

## 2015-06-28 NOTE — Progress Notes (Addendum)
Pt with periods of sleep apnea with O2 sats dipping into low 80s but quickly rebounding to upper 90s on 2LNC. Pt mouth breathing so placed on Venti mask, pt states she has been told she has sleep apnea and that she needs to wear CPAP but cannot tolerate wearing the mask. Will closely monitor pt.

## 2015-06-28 NOTE — Progress Notes (Signed)
1 Day Post-Op Procedure(s) (LRB): TRANSCATHETER AORTIC VALVE REPLACEMENT, TRANSFEMORAL (N/A) TRANSESOPHAGEAL ECHOCARDIOGRAM (TEE) (N/A) Subjective:  No complaints  Objective: Vital signs in last 24 hours: Temp:  [97.5 F (36.4 C)-99.6 F (37.6 C)] 99.6 F (37.6 C) (02/22 0840) Pulse Rate:  [48-114] 85 (02/22 0700) Cardiac Rhythm:  [-] Atrial fibrillation (02/22 0700) Resp:  [0-31] 11 (02/22 0700) BP: (98-149)/(60-100) 135/76 mmHg (02/22 0700) SpO2:  [83 %-100 %] 97 % (02/22 0700) Arterial Line BP: (130-191)/(56-89) 133/65 mmHg (02/22 0500) FiO2 (%):  [50 %] 50 % (02/22 0600) Weight:  [80.1 kg (176 lb 9.4 oz)] 80.1 kg (176 lb 9.4 oz) (02/22 0500)  Hemodynamic parameters for last 24 hours:    Intake/Output from previous day: 02/21 0701 - 02/22 0700 In: 3078.1 [P.O.:240; I.V.:2138.1; IV Piggyback:700] Out: 1900 [Urine:1900] Intake/Output this shift:    General appearance: alert and cooperative Neurologic: intact Heart: irregularly irregular rhythm and no murmur Lungs: clear to auscultation bilaterally Extremities: extremities normal, atraumatic, no cyanosis or edema Wound: left groin ok  Lab Results:  Recent Labs  06/27/15 1947 06/28/15 0340  WBC 8.1 7.4  HGB 11.4* 11.6*  HCT 36.5 37.2  PLT 140* 126*   BMET:  Recent Labs  06/27/15 1932 06/27/15 1947 06/28/15 0340  NA 137  --  138  K 4.4  --  4.1  CL 97*  --  102  CO2  --   --  28  GLUCOSE 110*  --  112*  BUN 14  --  7  CREATININE 0.50 0.50 0.60  CALCIUM  --   --  8.8*    PT/INR:  Recent Labs  06/27/15 1330  LABPROT 16.5*  INR 1.32   ABG    Component Value Date/Time   PHART 7.343* 06/27/2015 1805   HCO3 27.1* 06/27/2015 1805   TCO2 27 06/27/2015 1932   O2SAT 94.0 06/27/2015 1805   CBG (last 3)   Recent Labs  06/27/15 1701  GLUCAP 105*   CLINICAL DATA: Aortic valve replacement.  EXAM: PORTABLE CHEST 1 VIEW  COMPARISON: 06/27/2015 .  FINDINGS: Right IJ sheath in stable  position. Cardiomegaly with aortic valve replacement. There improvement pulmonary venous congestion interstitial prominence suggesting improving congestive heart failure. Small left pleural effusion cannot be excluded. No pneumothorax.  IMPRESSION: 1. Right IJ sheath in stable position.  2. Interim cardiac valve replacement. Persistent cardiomegaly. Interim improvement of pulmonary venous congestion and interstitial prominence suggesting improving congestive heart failure. Small left pleural effusion cannot be excluded.   Electronically Signed  By: Marcello Moores Register  On: 06/28/2015 07:56  Assessment/Plan: S/P Procedure(s) (LRB): TRANSCATHETER AORTIC VALVE REPLACEMENT, TRANSFEMORAL (N/A) TRANSESOPHAGEAL ECHOCARDIOGRAM (TEE) (N/A)  POD 1 Hemodynamically stable in atrial fibrillation Remove sleeve and foley Resume Eliquis when ok with Dr. Burt Knack. 2D echo today Transfer to 2W    LOS: 1 day    Tara Mack 06/28/2015

## 2015-06-28 NOTE — Progress Notes (Signed)
  Echocardiogram 2D Echocardiogram has been performed.  Tara Mack 06/28/2015, 12:57 PM

## 2015-06-29 ENCOUNTER — Telehealth: Payer: Self-pay | Admitting: Cardiovascular Disease

## 2015-06-29 ENCOUNTER — Encounter (HOSPITAL_COMMUNITY): Payer: Self-pay | Admitting: Physician Assistant

## 2015-06-29 DIAGNOSIS — I350 Nonrheumatic aortic (valve) stenosis: Secondary | ICD-10-CM

## 2015-06-29 DIAGNOSIS — R5382 Chronic fatigue, unspecified: Secondary | ICD-10-CM

## 2015-06-29 DIAGNOSIS — E669 Obesity, unspecified: Secondary | ICD-10-CM

## 2015-06-29 DIAGNOSIS — I272 Pulmonary hypertension, unspecified: Secondary | ICD-10-CM

## 2015-06-29 DIAGNOSIS — I340 Nonrheumatic mitral (valve) insufficiency: Secondary | ICD-10-CM

## 2015-06-29 DIAGNOSIS — I2510 Atherosclerotic heart disease of native coronary artery without angina pectoris: Secondary | ICD-10-CM

## 2015-06-29 DIAGNOSIS — E785 Hyperlipidemia, unspecified: Secondary | ICD-10-CM

## 2015-06-29 DIAGNOSIS — I482 Chronic atrial fibrillation: Secondary | ICD-10-CM

## 2015-06-29 LAB — CBC
HEMATOCRIT: 39.5 % (ref 36.0–46.0)
Hemoglobin: 12.4 g/dL (ref 12.0–15.0)
MCH: 33.1 pg (ref 26.0–34.0)
MCHC: 31.4 g/dL (ref 30.0–36.0)
MCV: 105.3 fL — AB (ref 78.0–100.0)
Platelets: 137 10*3/uL — ABNORMAL LOW (ref 150–400)
RBC: 3.75 MIL/uL — ABNORMAL LOW (ref 3.87–5.11)
RDW: 14.2 % (ref 11.5–15.5)
WBC: 8 10*3/uL (ref 4.0–10.5)

## 2015-06-29 LAB — BASIC METABOLIC PANEL
ANION GAP: 11 (ref 5–15)
BUN: 8 mg/dL (ref 6–20)
CALCIUM: 8.9 mg/dL (ref 8.9–10.3)
CO2: 29 mmol/L (ref 22–32)
Chloride: 98 mmol/L — ABNORMAL LOW (ref 101–111)
Creatinine, Ser: 0.55 mg/dL (ref 0.44–1.00)
GFR calc Af Amer: >60 mL/min (ref >60)
GFR calc non Af Amer: >60 mL/min (ref >60)
GLUCOSE: 133 mg/dL — AB (ref 65–99)
POTASSIUM: 3.7 mmol/L (ref 3.5–5.1)
Sodium: 138 mmol/L (ref 135–145)

## 2015-06-29 MED ORDER — DILTIAZEM HCL ER COATED BEADS 180 MG PO CP24: 180.0000 mg | ORAL_CAPSULE | Freq: Every day | ORAL | Status: DC

## 2015-06-29 MED ORDER — DILTIAZEM HCL ER 60 MG PO CP12
60.0000 mg | ORAL_CAPSULE | Freq: Once | ORAL | Status: AC
  Administered 2015-06-29: 60 mg via ORAL
  Filled 2015-06-29 (×2): qty 1

## 2015-06-29 MED FILL — Insulin Regular (Human) Inj 100 Unit/ML: INTRAMUSCULAR | Qty: 250 | Status: AC

## 2015-06-29 NOTE — Progress Notes (Signed)
CARDIAC REHAB PHASE I   PRE:  Rate/Rhythm: 97 a. fib  BP:  Sitting: 136/75        SaO2: 93 RA  MODE:  Ambulation: 350 ft   POST:  Rate/Rhythm: 115 a.fib c/ occ PVC  BP:  Sitting: 136/69         SaO2: 91 RA  Pt in bed asleep, states she has not ambulated since surgery. Pt agreeable to walk. Pt ambulated 350 ft on RA, hand held assist, mildly unsteady gait (pt states this is her baseline d/t "bad knees"), tolerated fair. Pt c/o mild DOE (states it is much improved since surgery), c/o knee pain, denies any other complaints. Cardiac surgery discharge education completed with pt and husband at bedside. Reviewed IS (pt does not have one-RN notified), activity progression/restrictions, exercise guidelines, daily weights, heart healthy diet, sodium restrictions, and phase 2 cardiac rehab. Pt verbalized understanding. Pt agrees to phase 2 cardiac rehab referral, will send to [facility]. Pt to bed per pt request after walk, call bell within reach.   [id number] Lenna Sciara, RN, BSN 06/29/2015 10:31 AM

## 2015-06-29 NOTE — Progress Notes (Signed)
Pt. Discharged to home with husband Pt. D/C'd via wheelchair with staff Discharge information reviewed and given All personal belongings given to Pt.  Education discussed and reviewed IV was d/c and intact upon removal Tele d/c

## 2015-06-29 NOTE — Telephone Encounter (Signed)
TCM   Appt with Grandville Silos 07/06/2015 on flex per Sharrell Ku

## 2015-06-29 NOTE — Care Management Note (Signed)
Case Management Note Previous CM note initiated by Elenor Quinones RN, CM  Patient Details  Name: Tara Mack MRN: [id number] Date of Birth: 03-22-1931  Subjective/Objective:       Pt is s/p TAVR             Action/Plan:  Pt is from home with husband, pt mobility at home is restricted due to sever degenerative disease.  PT will be requested prior to discharge.  CM will continue to monitor   Expected Discharge Date:     06/29/15             Expected Discharge Plan:  Home/Self Care  In-House Referral:     Discharge planning Services  CM Consult  Post Acute Care Choice:    Choice offered to:     DME Arranged:    DME Agency:     HH Arranged:    [place] Agency:     Status of Service:  Completed, signed off  Medicare Important Message Given:    Date Medicare IM Given:    Medicare IM give by:    Date Additional Medicare IM Given:    Additional Medicare Important Message give by:     If discussed at [place] of Stay Meetings, dates discussed:    Discharge Disposition: home/self care  Additional Comments:  Dawayne Patricia, RN 06/29/2015, 3:48 PM

## 2015-06-29 NOTE — Discharge Summary (Signed)
Discharge Summary    Patient ID: NIL ANSTETT,  MRN: [id number], DOB/AGE: [birth year] 82 y.o.  Admit date: 06/27/2015 Discharge date: 06/29/2015  Primary Care Provider: Mathews Argyle Primary Cardiologist: Stanford Breed / Burt Knack (TAVR)  Discharge Diagnoses    Principal Problem:   S/P TAVR (transcatheter aortic valve replacement) Active Problems:   Severe aortic stenosis   Essential hypertension, benign   Chronic atrial fibrillation (HCC)   Chronic diastolic (congestive) heart failure (HCC)   Hyperlipidemia   Chronic fatigue   Mitral regurgitation   Mild CAD   Obesity   Pulmonary hypertension ([place])   Allergies No Known Allergies  Diagnostic Studies/Procedures    TAVR - see OP note for details. _____________   History of Present Illness & Hospital     Ms. Tara Mack is an 82 y/o F with history of severe AS, chronic atrial fib, chronic diastolic CHF, HTN, hypothyroidism, depression and chronic fatigue who presented for planned TAVR. She was evaluated by Dr. Stanford Breed in 04/2015 for progressive DOE in the setting of known AS. R/LHC 05/15/15 showed severe AS, mild nonobstructive CAD, and mod pulm HTN. She was seen in consultation by both Dr. Roxy Manns and Dr. Cyndia Bent who agreed that she would fare better with TAVR than conventional AVR. She has longstanding complaints of chronic fatigue and progresive degenerative arthritis. There were concerns that risks associated with conventional AVR would be elevated given her age and comorbid conditions/mobility. She was brought in for TAVR 06/27/15 and underwent successful Transcatheter Aortic Valve Replacement via Percutaneous Transfemoral Approach with Edwards Sapien 3 THV (size 23 mm, model # 9600TFX, serial # [id number]). F/u echo 06/28/15 was tehcnically difficult but appeared to show mod LVH, AV s/p TAVR without apparent AI, no significant gradient across valve, mild MR, severely dilated LA, mild PR, PASP 95mmHg. The patient's activity was progressed  with cardiac rehab and post-op hospital course was fairly uncomplicated. She had mild post-op anemia and thrombocytopenia as expected. Eliquis was resumed. Initially her diltiazem dose was lowered to 120mg  during her hospital stay; by day of d/c HR was in the 90s and thus Dr. Burt Knack recommended she go back on home dose of 180mg  daily. Her Lasix was also resumed at discharge. The patient feels well today. Dr. Burt Knack has seen and examined the patient today and feels she is stable for discharge. She will have app TOC visit in 1 week and then has f/u already scheduled with Dr. Burt Knack with echo for 1 month out.  _____________  Discharge Vitals Blood pressure 137/93, pulse 95, temperature 98.1 F (36.7 C), temperature source Oral, resp. rate 18, height 5\' 2"  (1.575 m), weight 171 lb 6.4 oz (77.747 kg), SpO2 93 %.  Filed Weights   06/27/15 0846 06/28/15 0500 06/29/15 0625  Weight: 175 lb (79.379 kg) 176 lb 9.4 oz (80.1 kg) 171 lb 6.4 oz (77.747 kg)    Labs & Radiologic Studies     CBC  Recent Labs  06/28/15 0340 06/29/15 0345  WBC 7.4 8.0  HGB 11.6* 12.4  HCT 37.2 39.5  MCV 104.8* 105.3*  PLT 126* 0000000*   Basic Metabolic Panel  Recent Labs  06/27/15 1947 06/28/15 0340 06/29/15 0345  NA  --  138 138  K  --  4.1 3.7  CL  --  102 98*  CO2  --  28 29  GLUCOSE  --  112* 133*  BUN  --  7 8  CREATININE 0.50 0.60 0.55  CALCIUM  --  8.8* 8.9  MG  1.8 1.8  --    Dg Chest 2 View  06/23/2015  CLINICAL DATA:  Preoperative study prior to transcatheter aortic valve replacement next week, history of CHF EXAM: CHEST  2 VIEW COMPARISON:  Chest x-ray of April 24, 2015 FINDINGS: The lungs are well-expanded. There is chronic blunting of the left lateral costophrenic angle. The posterior costophrenic angles are sharp. The cardiac silhouette remains enlarged. The central pulmonary vascularity is prominent. The interstitial markings in both lungs are slightly more conspicuous today. The observed bony  thorax is unremarkable. IMPRESSION: Cardiomegaly and chronic CHF. Slight interval deterioration in the appearance of the pulmonary interstitium since the previous study may reflect slight worsening of interstitial edema. Otherwise no acute cardiopulmonary abnormality. Electronically Signed   By: David  Martinique M.D.   On: 06/23/2015 15:07   Dg Chest Port 1 View  06/28/2015  CLINICAL DATA:  Aortic valve replacement. EXAM: PORTABLE CHEST 1 VIEW COMPARISON:  06/27/2015 . FINDINGS: Right IJ sheath in stable position. Cardiomegaly with aortic valve replacement. There improvement pulmonary venous congestion interstitial prominence suggesting improving congestive heart failure. Small left pleural effusion cannot be excluded. No pneumothorax. IMPRESSION: 1. Right IJ sheath in stable position. 2. Interim cardiac valve replacement. Persistent cardiomegaly. Interim improvement of pulmonary venous congestion and interstitial prominence suggesting improving congestive heart failure. Small left pleural effusion cannot be excluded. Electronically Signed   By: Marcello Moores  Register   On: 06/28/2015 07:56   Dg Chest Port 1 View  06/27/2015  CLINICAL DATA:  Post transcatheter aortic valve replacement EXAM: PORTABLE CHEST 1 VIEW COMPARISON:  06/23/2015 FINDINGS: Cardiomegaly again noted. Aortic valve prosthesis is noted. There is right IJ sheath with tip in upper SVC. Central mild vascular congestion and minimal perihilar interstitial prominence suspicious for mild interstitial edema. Mild basilar atelectasis. No segmental infiltrate. Atherosclerotic calcifications of thoracic aorta again noted. No pneumothorax. IMPRESSION: Status post TAVR. Cardiomegaly again noted. Central mild vascular congestion and minimal perihilar interstitial prominence suspicious for mild interstitial edema. Right IJ sheath in place. No segmental infiltrate. Mild basilar atelectasis. Electronically Signed   By: Lahoma Crocker M.D.   On: 06/27/2015 13:42     Disposition   Pt is being discharged home today in good condition.  Follow-up Plans & Appointments    Follow-up Information    Follow up with THOMPSON, KATHRYN R, PA-C.   Specialties:  Cardiology, Radiology   Why:  [facility] - 07/06/15 at 9am. Please note this is the [place]. Joellen Jersey is one of the PAs that works with Dr. Jeronimo Greaves. Stanford Breed.   Contact information:   [address] [place] [id number] [phone]       Follow up with Sherren Mocha, MD.   Specialty:  Cardiology   Why:  [facility] - 07/28/15 at 9:30am for heart ultrasound and 10:30am for appointment.  As above, this is at the [facility] location.   Contact information:   [id number] [address] [phone]      Discharge Instructions    Amb Referral to Cardiac Rehabilitation    Complete by:  As directed   Diagnosis:  Valve Replacement/Repair  Valve:  Aortic Comment - TAVR     Diet - low sodium heart healthy    Complete by:  As directed      Increase activity slowly    Complete by:  As directed   No driving until cleared by your doctor. No lifting over  10 lbs for 2 weeks. Keep procedure site clean & dry. If you notice increased pain, swelling, bleeding or pus, call/return! You may shower, but no soaking baths/hot tubs/pools until cleared by your doctor.           Discharge Medications   Current Discharge Medication List    CONTINUE these medications which have NOT CHANGED   Details  acetaminophen (TYLENOL) 500 MG tablet Take 1,000 mg by mouth 3 (three) times daily as needed for mild pain or headache (for arthritis pain).     diltiazem (CARDIZEM CD) 180 MG 24 hr capsule Take 1 capsule (180 mg total) by mouth daily.     ELIQUIS 5 MG TABS tablet Take 5 mg by mouth 2 (two) times daily.     fluticasone (FLONASE) 50 MCG/ACT nasal spray Place 2 sprays into both nostrils daily. Reported on 05/29/2015     furosemide (LASIX) 20 MG tablet Take 20 mg by mouth daily.     guaiFENesin-dextromethorphan (ROBITUSSIN DM) 100-10 MG/5ML syrup Take 5 mLs by mouth daily as needed for cough.     !! levothyroxine (SYNTHROID, LEVOTHROID) 50 MCG tablet Take 50 mcg by mouth as directed. Tuesdays, Thursdays, Saturdays, Sundays    !! levothyroxine (SYNTHROID, LEVOTHROID) 75 MCG tablet Take 75 mcg by mouth as directed. Mondays, Wednesdays, and Fridays    losartan (COZAAR) 50 MG tablet TAKE ONE (1) TABLET BY MOUTH EVERY DAY     Omega-3 Fatty Acids (FISH OIL) 1000 MG CAPS Take 1,000 mg by mouth daily as needed (for supplementation).     tretinoin (RETIN-A) 0.05 % cream Apply 1 application topically daily as needed (for skin). Apply a small amount to skin daily    venlafaxine XR (EFFEXOR-XR) 37.5 MG 24 hr capsule Take 37.5 mg by mouth daily.      !! - Potential duplicate medications found. Please discuss with provider.        Outstanding Labs/Studies   N/A  Duration of Discharge Encounter   Greater than 30 minutes including physician time.  Signed, Charlie Pitter PA-C 06/29/2015, 12:41 PM

## 2015-06-29 NOTE — Progress Notes (Addendum)
    Subjective:  Doing ok. Not sleeping well. No CP or shortness of breath.  Objective:  Vital Signs in the last 24 hours: Temp:  [98.1 F (36.7 C)-99.6 F (37.6 C)] 98.1 F (36.7 C) (02/23 0625) Pulse Rate:  [78-98] 95 (02/23 0625) Resp:  [12-26] 18 (02/23 0625) BP: (100-151)/(54-114) 137/93 mmHg (02/23 1052) SpO2:  [93 %-97 %] 93 % (02/23 0625) Weight:  [77.747 kg (171 lb 6.4 oz)] 77.747 kg (171 lb 6.4 oz) (02/23 0625)  Intake/Output from previous day: 02/22 0701 - 02/23 0700 In: -  Out: 230 [Urine:230]  Physical Exam: Pt is alert and oriented, NAD HEENT: normal Neck: JVP - normal, carotids 2+= without bruits Lungs: CTA bilaterally CV: RRR without murmur or gallop Abd: soft, NT, Positive BS, no hepatomegaly Ext: no C/C/E, distal pulses intact and equal Skin: warm/dry no rash   Lab Results:  Recent Labs  06/28/15 0340 06/29/15 0345  WBC 7.4 8.0  HGB 11.6* 12.4  PLT 126* 137*    Recent Labs  06/28/15 0340 06/29/15 0345  NA 138 138  K 4.1 3.7  CL 102 98*  CO2 28 29  GLUCOSE 112* 133*  BUN 7 8  CREATININE 0.60 0.55   No results for input(s): TROPONINI in the last 72 hours.  Invalid input(s): CK, MB  Cardiac Studies: 2D Echo (POD#1): Study Conclusions  - Left ventricle: Overall LVF appears preserved but cannot give accurate assessment of EF or wall motion due to poor visualization. The cavity size was normal. There was moderate concentric hypertrophy. Systolic function was normal. Images were inadequate for LV wall motion assessment. - Aortic valve: S/P TAVR. AV prosthesis is poorly visualized. There is no apparent aortic insufficiency. No significant gradient across the AV. Poorly visualized. Mean gradient (S): 9 mm Hg. - Mitral valve: Mild focal calcification of the anterior leaflet (medial segment(s)). There was mild regurgitation. - Left atrium: The atrium was severely dilated. - Pulmonic valve: There was mild regurgitation. -  Pulmonary arteries: PA peak pressure: 49 mm Hg (S). - Pericardium, extracardiac: A trivial pericardial effusion was identified posterior to the heart. - Recommendations: Poor visualization of endocardial segments prohibits evaluation for wall motion abnormalities. Recommend limited echo with definity to evaluate further.  Impressions:  - The right ventricular systolic pressure was increased consistent with moderate pulmonary hypertension.  Tele: Atrial fibrillation, no pauses, HR 90s  Assessment/Plan:  1. Severe symptomatic AS POD #2 from TAVR: pt looks good. Stable for discharge. Recommend outpatient APP follow-up in 1-2 weeks, valve clinic appt at 30 days is arranged. Post-op echo shows normal gradients and no AI. Continue Eliquis.   2. Atrial fibrillation, chronic. On Eliquis. Increase cardizem CD back to 180 mg daily (home dose).   Dispo: DC home today.   Sherren Mocha, M.D. 06/29/2015, 12:40 PM

## 2015-06-29 NOTE — Progress Notes (Signed)
2 Days Post-Op Procedure(s) (LRB): TRANSCATHETER AORTIC VALVE REPLACEMENT, TRANSFEMORAL (N/A) TRANSESOPHAGEAL ECHOCARDIOGRAM (TEE) (N/A) Subjective:  Complains of knee pain and putting ice on them. This is a chronic problem for her. Has not ambulated yet.  Objective: Vital signs in last 24 hours: Temp:  [98.1 F (36.7 C)-99.6 F (37.6 C)] 98.1 F (36.7 C) (02/23 0625) Pulse Rate:  [78-115] 95 (02/23 0625) Cardiac Rhythm:  [-] Atrial fibrillation (02/23 0727) Resp:  [12-28] 18 (02/23 0625) BP: (100-151)/(54-114) 113/81 mmHg (02/23 0625) SpO2:  [93 %-97 %] 93 % (02/23 0625) Weight:  [77.747 kg (171 lb 6.4 oz)] 77.747 kg (171 lb 6.4 oz) (02/23 0625)  Hemodynamic parameters for last 24 hours:    Intake/Output from previous day: 02/22 0701 - 02/23 0700 In: -  Out: 230 [Urine:230] Intake/Output this shift:    General appearance: alert and cooperative Neurologic: intact Heart: regular rate and rhythm, S1, S2 normal, no murmur, click, rub or gallop Lungs: clear to auscultation bilaterally Extremities: extremities normal, atraumatic, no cyanosis or edema Wound: left groin site ok  Lab Results:  Recent Labs  06/28/15 0340 06/29/15 0345  WBC 7.4 8.0  HGB 11.6* 12.4  HCT 37.2 39.5  PLT 126* 137*   BMET:  Recent Labs  06/28/15 0340 06/29/15 0345  NA 138 138  K 4.1 3.7  CL 102 98*  CO2 28 29  GLUCOSE 112* 133*  BUN 7 8  CREATININE 0.60 0.55  CALCIUM 8.8* 8.9    PT/INR:  Recent Labs  06/27/15 1330  LABPROT 16.5*  INR 1.32   ABG    Component Value Date/Time   PHART 7.343* 06/27/2015 1805   HCO3 27.1* 06/27/2015 1805   TCO2 27 06/27/2015 1932   O2SAT 94.0 06/27/2015 1805   CBG (last 3)   Recent Labs  06/27/15 [facility]*            [address], [place] [address]               [phone]  ------------------------------------------------------------------- Transthoracic Echocardiography  Patient:  Tara Mack, Tara Mack MR #:    [id number] Study Date: 06/28/2015 Gender:   F Age:    82 Height:   157.5 cm Weight:   79.8 kg BSA:    1.9 m^2 Pt. Status: Room:    2S10C  ORDERING   Gilford Raid, MD SONOGRAPHER [facility] ADMITTING  Sherren Mocha, MD ATTENDING  Sherren Mocha, MD PERFORMING  [facility], Inpatient  cc:  -------------------------------------------------------------------  ------------------------------------------------------------------- Indications:   Aortic stenosis 424.1. TIA 435.9.  ------------------------------------------------------------------- History:  PMH: Post TAVR. Congestive heart failure. Risk factors: Hypertension. Dyslipidemia.  ------------------------------------------------------------------- Study Conclusions  - Left ventricle: Overall LVF appears preserved but cannot give accurate assessment of EF or wall motion due to poor visualization. The cavity size was normal. There was moderate concentric hypertrophy. Systolic function was normal. Images were inadequate for LV wall motion assessment. - Aortic valve: S/P TAVR. AV prosthesis is poorly visualized. There is no apparent aortic insufficiency. No significant gradient across the AV. Poorly visualized. Mean gradient (S): 9 mm Hg. - Mitral valve: Mild focal calcification of the anterior leaflet (medial segment(s)). There was mild regurgitation. - Left atrium: The atrium was severely dilated. - Pulmonic valve: There was mild regurgitation. - Pulmonary arteries: PA peak pressure: 49  mm Hg (S). - Pericardium, extracardiac: A trivial pericardial effusion was identified posterior to the heart. - Recommendations: Poor visualization of endocardial segments prohibits evaluation for wall motion abnormalities.  Recommend limited echo with definity to evaluate further.  Impressions:  - The right ventricular systolic pressure was increased consistent with moderate pulmonary hypertension.  Recommendations: Poor visualization of endocardial segments prohibits evaluation for wall motion abnormalities. Recommend limited echo with definity to evaluate further. Transthoracic echocardiography. M-mode, complete 2D, spectral Doppler, and color Doppler. Birthdate: Patient birthdate: 1930-12-26. Age: Patient is 82 yr old. Sex: Gender: female. BMI: 32.2 kg/m^2. Blood pressure:   135/76 Patient status: Inpatient. Study date: Study date: 06/28/2015. Study time: 12:16 PM. Location: ICU/CCU  -------------------------------------------------------------------  ------------------------------------------------------------------- Left ventricle: Overall LVF appears preserved but cannot give accurate assessment of EF or wall motion due to poor visualization. Poorly visualized. The cavity size was normal. There was moderate concentric hypertrophy. Systolic function was normal. Images were inadequate for LV wall motion assessment. Normal sinus rhythm was absent. The study was not technically sufficient to allow evaluation of LV diastolic dysfunction due to atrial fibrillation.  ------------------------------------------------------------------- Aortic valve: S/P TAVR. AV prosthesis is poorly visualized. There is no apparent aortic insufficiency. No significant gradient across the AV. Poorly visualized. Mobility was not restricted. Doppler: Transvalvular velocity was within the normal range. There was no stenosis. There was no regurgitation.  VTI ratio of LVOT to aortic valve: 0.4. Peak velocity ratio of LVOT to aortic valve: 0.36. Mean velocity ratio of LVOT to aortic valve: 0.42.  Mean gradient (S): 9 mm Hg. Peak gradient (S): 19 mm  Hg.  ------------------------------------------------------------------- Aorta: Aortic root: The aortic root was normal in size.  ------------------------------------------------------------------- Mitral valve:  Mild focal calcification of the anterior leaflet (medial segment(s)). Mobility was not restricted. Doppler: Transvalvular velocity was within the normal range. There was no evidence for stenosis. There was mild regurgitation.  ------------------------------------------------------------------- Left atrium: The atrium was severely dilated.  ------------------------------------------------------------------- Right ventricle: The cavity size was normal. Wall thickness was normal. Systolic function was normal.  ------------------------------------------------------------------- Pulmonic valve:  Structurally normal valve.  Cusp separation was normal. Doppler: Transvalvular velocity was within the normal range. There was no evidence for stenosis. There was mild regurgitation.  ------------------------------------------------------------------- Tricuspid valve:  Structurally normal valve.  Doppler: Transvalvular velocity was within the normal range. There was mild regurgitation.  ------------------------------------------------------------------- Pulmonary artery:  The main pulmonary artery was normal-sized. Systolic pressure was within the normal range.  ------------------------------------------------------------------- Right atrium: The atrium was normal in size.  ------------------------------------------------------------------- Pericardium: A trivial pericardial effusion was identified posterior to the heart.  ------------------------------------------------------------------- Systemic veins: Inferior vena cava: The vessel was normal in size.  ------------------------------------------------------------------- Measurements  Left ventricle              Value    Reference LV ID, ED, PLAX chordal    (L)   39  mm   43 - 52 LV ID, ES, PLAX chordal        29  mm   23 - 38 LV fx shortening, PLAX chordal (L)   26  %   >=29 LV PW thickness, ED          15  mm   --------- IVS/LV PW ratio, ED          0.87     <=1.3 LV e&', lateral             8.45 cm/s  --------- LV e&', medial  7.58 cm/s  --------- LV e&', average             8.02 cm/s  ---------  Ventricular septum           Value    Reference IVS thickness, ED           13  mm   ---------  LVOT                  Value    Reference LVOT peak velocity, S         77.8 cm/s  --------- LVOT mean velocity, S         57.5 cm/s  --------- LVOT VTI, S              14.1 cm   --------- LVOT peak gradient, S         2   mm Hg ---------  Aortic valve              Value    Reference Aortic valve peak velocity, S     216  cm/s  --------- Aortic valve mean velocity, S     136  cm/s  --------- Aortic valve VTI, S          35.1 cm   --------- Aortic mean gradient, S        9   mm Hg --------- Aortic peak gradient, S        19  mm Hg --------- VTI ratio, LVOT/AV           0.4     --------- Velocity ratio, peak, LVOT/AV     0.36     --------- Velocity ratio, mean, LVOT/AV     0.42     ---------  Left atrium              Value    Reference LA ID, A-P, ES             42  mm   --------- LA ID/bsa, A-P         (H)   2.21 cm/m^2 <=2.2 LA volume, S              134  ml   --------- LA volume/bsa, S            70.5 ml/m^2 --------- LA volume, ES, 1-p A4C         125  ml    --------- LA volume/bsa, ES, 1-p A4C       65.8 ml/m^2 --------- LA volume, ES, 1-p A2C         141  ml   --------- LA volume/bsa, ES, 1-p A2C       74.2 ml/m^2 ---------  Pulmonary arteries           Value    Reference PA pressure, S, DP       (H)   49  mm Hg <=30  Tricuspid valve            Value    Reference Tricuspid regurg peak velocity     339  cm/s  --------- Tricuspid peak RV-RA gradient     46  mm Hg ---------  Pulmonic valve             Value    Reference Pulmonic regurg velocity, ED      132  cm/s  --------- Pulmonic regurg gradient, ED      7   mm Hg ---------  Legend: (  L) and (H) mark values outside specified reference range.  ------------------------------------------------------------------- Prepared and Electronically Authenticated by  Fransico Him, MD 2017-02-22T13:11:44   Assessment/Plan: S/P Procedure(s) (LRB): TRANSCATHETER AORTIC VALVE REPLACEMENT, TRANSFEMORAL (N/A) TRANSESOPHAGEAL ECHOCARDIOGRAM (TEE) (N/A)  She is hemodynamically stable. Echo looks ok. Hopefully she can ambulate this morning and go home later today.   LOS: 2 days    Gaye Pollack 06/29/2015

## 2015-06-30 LAB — TYPE AND SCREEN
ABO/RH(D): O POS
Antibody Screen: NEGATIVE
UNIT DIVISION: 0
Unit division: 0

## 2015-06-30 NOTE — Telephone Encounter (Signed)
Patient contacted regarding discharge from blank on blank.   Patient understands to follow up with provider Kathlene November on 07/05/2105 at 0900 Patient understands discharge instructions Patient understands medications and regiment Patient understands to bring all medications to this visit

## 2015-07-03 NOTE — Progress Notes (Signed)
Cardiology Office Note   Date:  07/06/2015   ID:  Tara Mack, Tara Mack December 01, 1930, MRN [id number]  PCP:  Mathews Argyle, MD  Cardiologist:  Kellie Shropshire (TAVR)   Posthospital follow-up-TAVR    History of Present Illness: Tara Mack is a 82 y.o. female with a history of severe AS, chronic atrial fib, chronic diastolic CHF, HTN, hypothyroidism, depression and chronic fatigue who presents to clinic for posthospital follow-up.  She was evaluated by Dr. Stanford Breed in 04/2015 for progressive DOE in the setting of known AS. R/LHC 05/15/15 showed severe AS, mild nonobstructive CAD, and mod pulm HTN. She was seen in consultation by both Dr. Roxy Manns and Dr. Cyndia Bent who agreed that she would fare better with TAVR than conventional AVR. She has longstanding complaints of chronic fatigue and progresive degenerative arthritis. There were concerns that risks associated with conventional AVR would be elevated given her age and comorbid conditions/mobility. She was brought in for TAVR 06/27/15 and underwent successful Transcatheter Aortic Valve Replacement via Percutaneous Transfemoral Approach with Edwards Sapien 3 THV (size 23 mm, model # 9600TFX, serial # [id number]). F/u echo 06/28/15 was tehcnically difficult but appeared to show mod LVH, AV s/p TAVR without apparent AI, no significant gradient across valve, mild MR, severely dilated LA, mild PR, PASP 49mmHg. The patient's activity was progressed with cardiac rehab and post-op hospital course was fairly uncomplicated. She had mild post-op anemia and thrombocytopenia as expected. Eliquis was resumed. Initially her diltiazem dose was lowered to 120mg  during her hospital stay; by day of d/c HR was in the 90s and thus Dr. Burt Knack recommended she go back on home dose of 180mg  daily. Her Lasix was also resumed at discharge.  Presents to clinic for follow-up. She denies CP or SOB. No LE edema, orthopnea or PND. No palpations. No blood in her stool or urine. She is  overall feeling great after TAVR.    Past Medical History  Diagnosis Date  . Chronic atrial fibrillation ([place])   . Hyperlipidemia   . Essential hypertension   . ANEMIA NOS   . BICEPS TENDON RUPTURE, LEFT   . Closed anterior dislocation of humerus   . DEPRESSION, MAJOR, RECURRENT   . FATIGUE, CHRONIC   . Hypothyroidism   . Obesity   . OSTEOARTHRITIS, MULTI SITES   . Rosacea   . SUBACROMIAL BURSITIS, RIGHT   . Breast cancer ([place])   . Chronic diastolic (congestive) heart failure ([place])   . Chronic atrial fibrillation ([place]) 07/17/2006    Qualifier: Diagnosis of  By: Oneida Alar MD, KARL    . Severe aortic stenosis 06/24/2013    43 mmHg mean gradient Aug 2016   . Mild CAD     a. Mild nonobstructive CAD by cath 05/2015.  . Mitral regurgitation     a. Mild MR by echo after TAVR 06/2015.  . Pulmonary HTN ([place])     a. Moderate by studies in 2017.    Past Surgical History  Procedure Laterality Date  . Mastectomy Right   . Tonsillectomy    . Abdominal hysterectomy    . Cardiac catheterization N/A 05/15/2015    Procedure: Right/Left Heart Cath and Coronary Angiography;  Surgeon: Sherren Mocha, MD;  Location: [facility];  Service: Cardiovascular;  Laterality: N/A;  . Transcatheter aortic valve replacement, transfemoral N/A 06/27/2015    Procedure: TRANSCATHETER AORTIC VALVE REPLACEMENT, TRANSFEMORAL;  Surgeon: Sherren Mocha, MD;  Location: [facility];  Service: Open Heart Surgery;  Laterality: N/A;  .  Tee without cardioversion N/A 06/27/2015    Procedure: TRANSESOPHAGEAL ECHOCARDIOGRAM (TEE);  Surgeon: Sherren Mocha, MD;  Location: [facility];  Service: Open Heart Surgery;  Laterality: N/A;     Current Outpatient Prescriptions  Medication Sig Dispense Refill  . acetaminophen (TYLENOL) 500 MG tablet Take 1,000 mg by mouth 3 (three) times daily as needed for mild pain or headache (for arthritis pain).     Marland Kitchen diltiazem (CARDIZEM CD) 240 MG 24 hr capsule Take 1 capsule (240 mg total) by mouth  daily. 90 capsule 1  . ELIQUIS 5 MG TABS tablet Take 5 mg by mouth 2 (two) times daily.     . fluticasone (FLONASE) 50 MCG/ACT nasal spray Place 2 sprays into both nostrils daily. Reported on 05/29/2015    . furosemide (LASIX) 20 MG tablet Take 20 mg by mouth daily.     Marland Kitchen levothyroxine (SYNTHROID, LEVOTHROID) 50 MCG tablet Take 50 mcg by mouth as directed. Tuesdays, Thursdays, Saturdays, Sundays    . levothyroxine (SYNTHROID, LEVOTHROID) 75 MCG tablet Take 75 mcg by mouth as directed. Mondays, Wednesdays, and Fridays    . losartan (COZAAR) 50 MG tablet TAKE ONE (1) TABLET BY MOUTH EVERY DAY 90 tablet 1  . Omega-3 Fatty Acids (FISH OIL) 1000 MG CAPS Take 1,000 mg by mouth daily as needed (for supplementation).     . tretinoin (RETIN-A) 0.05 % cream Apply 1 application topically daily as needed (for skin). Apply a small amount to skin daily    . venlafaxine XR (EFFEXOR-XR) 37.5 MG 24 hr capsule Take 37.5 mg by mouth daily.      No current facility-administered medications for this visit.    Allergies:   Review of patient's allergies indicates no known allergies.    Social History:  The patient  reports that she has never smoked. She does not have any smokeless tobacco history on file. She reports that she drinks alcohol.   Family History:  The patient's family history includes CAD in her mother.    ROS:  Please see the history of present illness.   Otherwise, review of systems are positive for none.   All other systems are reviewed and negative.    PHYSICAL EXAM: VS:  BP 158/68 mmHg  Pulse 119  Ht 5\' 2"  (1.575 m)  Wt 175 lb 9.6 oz (79.652 kg)  BMI 32.11 kg/m2 , BMI Body mass index is 32.11 kg/(m^2). GEN: Well nourished, well developed, in no acute distress HEENT: normal Neck: no JVD, carotid bruits, or masses Cardiac: irreg irreg, tachycardic; no murmurs, rubs, or gallops,no edema  Respiratory:  clear to auscultation bilaterally, normal work of breathing GI: soft, nontender,  nondistended, + BS MS: no deformity or atrophy Skin: warm and dry, no rash Neuro:  Strength and sensation are intact Psych: euthymic mood, full affect   EKG:  EKG is ordered today. The ekg ordered today demonstrates atrial fibrillation HR 119   Recent Labs: 06/23/2015: ALT 11* 06/28/2015: Magnesium 1.8 06/29/2015: BUN 8; Creatinine, Ser 0.55; Hemoglobin 12.4; Platelets 137*; Potassium 3.7; Sodium 138    Lipid Panel    Component Value Date/Time   CHOL 243* 07/17/2006 2053      Wt Readings from Last 3 Encounters:  07/06/15 175 lb 9.6 oz (79.652 kg)  06/29/15 171 lb 6.4 oz (77.747 kg)  06/23/15 175 lb 9.6 oz (79.652 kg)      Other studies Reviewed: Additional studies/ records that were reviewed today include: 2D ECHO Review of the above records demonstrates:  2D ECHO: 06/28/2015 Study Conclusions - Left ventricle: Overall LVF appears preserved but cannot give accurate assessment of EF or wall motion due to poor visualization. The cavity size was normal. There was moderate concentric hypertrophy. Systolic function was normal. Images were inadequate for LV wall motion assessment. - Aortic valve: S/P TAVR. AV prosthesis is poorly visualized. There is no apparent aortic insufficiency. No significant gradient across the AV. Poorly visualized. Mean gradient (S): 9 mm Hg. - Mitral valve: Mild focal calcification of the anterior leaflet (medial segment(s)). There was mild regurgitation. - Left atrium: The atrium was severely dilated. - Pulmonic valve: There was mild regurgitation. - Pulmonary arteries: PA peak pressure: 49 mm Hg (S). - Pericardium, extracardiac: A trivial pericardial effusion was identified posterior to the heart. - Recommendations: Poor visualization of endocardial segments prohibits evaluation for wall motion abnormalities. Recommend limited echo with definity to evaluate further. Impressions: - The right ventricular systolic pressure  was increased consistent with moderate pulmonary hypertension.   ASSESSMENT AND PLAN:  Tara Mack is a 82 y.o. female with a history of severe AS, chronic atrial fib, chronic diastolic CHF, HTN, hypothyroidism, depression and chronic fatigue who presents to clinic for posthospital follow-up.  Severe AS s/p TAVR: 06/27/15 she underwent successful TAVR via Percutaneous Transfemoral Approach with Edwards Sapien 3 THV (size 23 mm, model # 9600TFX, serial # [id number]). Follow-up with Dr. Burt Knack w/ 2D ECHO in 55 month as previously scheduled.  Chronic atrial fib: Not well controlled on diltiazem 180 mg daily. HR 119 today. I will increase this to 240 mg daily (BP also a little elevated). Continue Eliquis 5 mg twice a day.  Chronic diastolic CHF: Appears euvolemic. Continue home dose of Lasix 20 mg daily.  HTN: BP A little elevated today. As above, I will increase Cardizem from 180mg  to 240mg  daily.   Current medicines are reviewed at length with the patient today.  The patient does not have concerns regarding medicines.  The following changes have been made:  no change  Labs/ tests ordered today include:   Orders Placed This Encounter  Procedures  . EKG 12-Lead     Disposition:   FU with me next Wednesday to check BP and HR   Signed, Eileen Stanford, PA-C  07/06/2015 9:42 AM    [facility] [place], [place], [place]  [address] Phone: [phone]; Fax: [phone]

## 2015-07-06 ENCOUNTER — Ambulatory Visit (INDEPENDENT_AMBULATORY_CARE_PROVIDER_SITE_OTHER): Payer: Medicare Other | Admitting: Physician Assistant

## 2015-07-06 ENCOUNTER — Encounter: Payer: Self-pay | Admitting: Physician Assistant

## 2015-07-06 VITALS — BP 158/68 | HR 119 | Ht 62.0 in | Wt 175.6 lb

## 2015-07-06 DIAGNOSIS — I482 Chronic atrial fibrillation, unspecified: Secondary | ICD-10-CM

## 2015-07-06 DIAGNOSIS — Z954 Presence of other heart-valve replacement: Secondary | ICD-10-CM | POA: Diagnosis not present

## 2015-07-06 DIAGNOSIS — I10 Essential (primary) hypertension: Secondary | ICD-10-CM

## 2015-07-06 DIAGNOSIS — I350 Nonrheumatic aortic (valve) stenosis: Secondary | ICD-10-CM

## 2015-07-06 DIAGNOSIS — I359 Nonrheumatic aortic valve disorder, unspecified: Secondary | ICD-10-CM

## 2015-07-06 DIAGNOSIS — Z952 Presence of prosthetic heart valve: Secondary | ICD-10-CM

## 2015-07-06 MED ORDER — DILTIAZEM HCL ER COATED BEADS 240 MG PO CP24: 240.0000 mg | ORAL_CAPSULE | Freq: Every day | ORAL | Status: DC

## 2015-07-06 NOTE — Patient Instructions (Addendum)
Medication Instructions:  Your physician has recommended you make the following change in your medication:  1.  INCREASE the Diltiazem to 240 mg taking 1 tablet daily  Labwork: None ordered  Testing/Procedures: None ordered  Follow-Up: Your physician recommends that you schedule a follow-up appointment in:  [place] [facility] (RECHECK B/P & PULSE)    Any Other Special Instructions Will Be Listed Below (If Applicable).   If you need a refill on your cardiac medications before your next appointment, please call your pharmacy.

## 2015-07-07 NOTE — Anesthesia Postprocedure Evaluation (Signed)
Anesthesia Post Note  Patient: Tara Mack  Procedure(s) Performed: Procedure(s) (LRB): TRANSCATHETER AORTIC VALVE REPLACEMENT, TRANSFEMORAL (N/A) TRANSESOPHAGEAL ECHOCARDIOGRAM (TEE) (N/A)  Patient location during evaluation: SICU Anesthesia Type: General Level of consciousness: awake, oriented and patient cooperative Pain management: pain level controlled Vital Signs Assessment: post-procedure vital signs reviewed and stable Respiratory status: spontaneous breathing, nonlabored ventilation and respiratory function stable Cardiovascular status: blood pressure returned to baseline and stable Postop Assessment: no signs of nausea or vomiting Anesthetic complications: no Comments: Delayed note, pt eval in SICU post op    Last Vitals:  Filed Vitals:   06/29/15 1052 06/29/15 1247  BP: 137/93 152/80  Pulse:  91  Temp:  36.8 C  Resp:  20    Last Pain:  Filed Vitals:   06/29/15 1250  PainSc: 0-No pain                 Ami Mally,E. Ceil Roderick

## 2015-07-12 ENCOUNTER — Encounter: Payer: Self-pay | Admitting: Physician Assistant

## 2015-07-12 ENCOUNTER — Ambulatory Visit (INDEPENDENT_AMBULATORY_CARE_PROVIDER_SITE_OTHER): Payer: Medicare Other | Admitting: Physician Assistant

## 2015-07-12 VITALS — BP 140/89 | HR 100 | Ht 62.0 in | Wt 174.8 lb

## 2015-07-12 DIAGNOSIS — I482 Chronic atrial fibrillation, unspecified: Secondary | ICD-10-CM

## 2015-07-12 NOTE — Progress Notes (Signed)
Cardiology Office Note   Date:  07/12/2015   ID:  Tara MAYERSON, DOB 03/03/1931, MRN [id number]  PCP:  Tara Argyle, MD  Cardiologist:  Tara Mack (TAVR)   BP and HR follow up    History of Present Illness: Tara Mack is a 82 y.o. female with a history of severe AS, chronic atrial fib, chronic diastolic CHF, HTN, hypothyroidism, depression and chronic fatigue who presents to clinic for posthospital follow-up.  She was evaluated by Dr. Stanford Mack in 04/2015 for progressive DOE in the setting of known AS. R/LHC 05/15/15 showed severe AS, mild nonobstructive CAD, and mod pulm HTN. She was seen in consultation by both Dr. Roxy Mack and Dr. Cyndia Mack who agreed that she would fare better with TAVR than conventional AVR. She has longstanding complaints of chronic fatigue and progresive degenerative arthritis. There were concerns that risks associated with conventional AVR would be elevated given her age and comorbid conditions/mobility. She was brought in for TAVR 06/27/15 and underwent successful Transcatheter Aortic Valve Replacement via Percutaneous Transfemoral Approach with Edwards Sapien 3 THV (size 23 mm, model # 9600TFX, serial # [id number]). F/u echo 06/28/15 was tehcnically difficult but appeared to show mod LVH, AV s/p TAVR without apparent AI, no significant gradient across valve, mild MR, severely dilated LA, mild PR, PASP 42mmHg. The patient's activity was progressed with cardiac rehab and post-op hospital course was fairly uncomplicated. She had mild post-op anemia and thrombocytopenia as expected. Eliquis was resumed. Initially her diltiazem dose was lowered to 120mg  during her hospital stay; by day of d/c HR was in the 90s and thus Dr. Burt Knack recommended she go back on home dose of 180mg  daily. Her Lasix was also resumed at discharge.  I saw her for post hospital follow up on 07/06/15 and she was doing great. However, her HR was not well controlled on diltiazem 180 mg daily (HR 119) and  her BP was a little elevated, so increased diltiazem to 240 mg daily (BP also a little elevated).   Today she presents to clinic for follow up of HR and BP after recent medication adjustment. She continues to do well with no complaints. BP and HR better controlled today.   Past Medical History  Diagnosis Date  . Chronic atrial fibrillation ([place])   . Hyperlipidemia   . Essential hypertension   . ANEMIA NOS   . BICEPS TENDON RUPTURE, LEFT   . Closed anterior dislocation of humerus   . DEPRESSION, MAJOR, RECURRENT   . FATIGUE, CHRONIC   . Hypothyroidism   . Obesity   . OSTEOARTHRITIS, MULTI SITES   . Rosacea   . SUBACROMIAL BURSITIS, RIGHT   . Breast cancer ([place])   . Chronic diastolic (congestive) heart failure ([place])   . Chronic atrial fibrillation ([place]) 07/17/2006    Qualifier: Diagnosis of  By: Oneida Alar MD, KARL    . Severe aortic stenosis 06/24/2013    43 mmHg mean gradient Aug 2016   . Mild CAD     a. Mild nonobstructive CAD by cath 05/2015.  . Mitral regurgitation     a. Mild MR by echo after TAVR 06/2015.  . Pulmonary HTN ([place])     a. Moderate by studies in 2017.    Past Surgical History  Procedure Laterality Date  . Mastectomy Right   . Tonsillectomy    . Abdominal hysterectomy    . Cardiac catheterization N/A 05/15/2015    Procedure: Right/Left Heart Cath and Coronary Angiography;  Surgeon: Tara Mack  Burt Knack, MD;  Location: [facility];  Service: Cardiovascular;  Laterality: N/A;  . Transcatheter aortic valve replacement, transfemoral N/A 06/27/2015    Procedure: TRANSCATHETER AORTIC VALVE REPLACEMENT, TRANSFEMORAL;  Surgeon: Tara Mocha, MD;  Location: [facility];  Service: Open Heart Surgery;  Laterality: N/A;  . Tee without cardioversion N/A 06/27/2015    Procedure: TRANSESOPHAGEAL ECHOCARDIOGRAM (TEE);  Surgeon: Tara Mocha, MD;  Location: [facility];  Service: Open Heart Surgery;  Laterality: N/A;     Current Outpatient Prescriptions  Medication Sig Dispense Refill  .  acetaminophen (TYLENOL) 500 MG tablet Take 1,000 mg by mouth 3 (three) times daily as needed for mild pain or headache (for arthritis pain).     Marland Kitchen diltiazem (CARDIZEM CD) 240 MG 24 hr capsule Take 1 capsule (240 mg total) by mouth daily. 90 capsule 1  . ELIQUIS 5 MG TABS tablet Take 5 mg by mouth 2 (two) times daily.     . fluticasone (FLONASE) 50 MCG/ACT nasal spray Place 2 sprays into both nostrils daily. Reported on 05/29/2015    . furosemide (LASIX) 20 MG tablet Take 20 mg by mouth daily.     Marland Kitchen levothyroxine (SYNTHROID, LEVOTHROID) 50 MCG tablet Take 50 mcg by mouth as directed. Tuesdays, Thursdays, Saturdays, Sundays    . levothyroxine (SYNTHROID, LEVOTHROID) 75 MCG tablet Take 75 mcg by mouth as directed. Mondays, Wednesdays, and Fridays    . losartan (COZAAR) 50 MG tablet TAKE ONE (1) TABLET BY MOUTH EVERY DAY 90 tablet 1  . Omega-3 Fatty Acids (FISH OIL) 1000 MG CAPS Take 1,000 mg by mouth daily as needed (for supplementation).     . tretinoin (RETIN-A) 0.05 % cream Apply 1 application topically daily as needed (for skin). Apply a small amount to skin daily    . venlafaxine XR (EFFEXOR-XR) 37.5 MG 24 hr capsule Take 37.5 mg by mouth daily.      No current facility-administered medications for this visit.    Allergies:   Review of patient's allergies indicates no known allergies.    Social History:  The patient  reports that she has never smoked. She does not have any smokeless tobacco history on file. She reports that she drinks alcohol.   Family History:  The patient's family history includes CAD in her mother.    ROS:  Please see the history of present illness.   Otherwise, review of systems are positive for none.   All other systems are reviewed and negative.    PHYSICAL EXAM: VS:  There were no vitals taken for this visit. , BMI There is no weight on file to calculate BMI. GEN: Well nourished, well developed, in no acute distress HEENT: normal Neck: no JVD, carotid bruits, or  masses Cardiac: irreg irreg, tachycardic; no murmurs, rubs, or gallops,no edema  Respiratory:  clear to auscultation bilaterally, normal work of breathing GI: soft, nontender, nondistended, + BS MS: no deformity or atrophy Skin: warm and dry, no rash Neuro:  Strength and sensation are intact Psych: euthymic mood, full affect   EKG:  EKG is ordered today. The ekg ordered today demonstrates atrial fibrillation HR 119   Recent Labs: 06/23/2015: ALT 11* 06/28/2015: Magnesium 1.8 06/29/2015: BUN 8; Creatinine, Ser 0.55; Hemoglobin 12.4; Platelets 137*; Potassium 3.7; Sodium 138    Lipid Panel    Component Value Date/Time   CHOL 243* 07/17/2006 2053      Wt Readings from Last 3 Encounters:  07/06/15 175 lb 9.6 oz (79.652 kg)  06/29/15 171  lb 6.4 oz (77.747 kg)  06/23/15 175 lb 9.6 oz (79.652 kg)      Other studies Reviewed: Additional studies/ records that were reviewed today include: 2D ECHO Review of the above records demonstrates:   2D ECHO: 06/28/2015 Study Conclusions - Left ventricle: Overall LVF appears preserved but cannot give accurate assessment of EF or wall motion due to poor visualization. The cavity size was normal. There was moderate concentric hypertrophy. Systolic function was normal. Images were inadequate for LV wall motion assessment. - Aortic valve: S/P TAVR. AV prosthesis is poorly visualized. There is no apparent aortic insufficiency. No significant gradient across the AV. Poorly visualized. Mean gradient (S): 9 mm Hg. - Mitral valve: Mild focal calcification of the anterior leaflet (medial segment(s)). There was mild regurgitation. - Left atrium: The atrium was severely dilated. - Pulmonic valve: There was mild regurgitation. - Pulmonary arteries: PA peak pressure: 49 mm Hg (S). - Pericardium, extracardiac: A trivial pericardial effusion was identified posterior to the heart. - Recommendations: Poor visualization of endocardial  segments prohibits evaluation for wall motion abnormalities. Recommend limited echo with definity to evaluate further. Impressions: - The right ventricular systolic pressure was increased consistent with moderate pulmonary hypertension.   ASSESSMENT AND PLAN:  Tara Mack is a 82 y.o. female with a history of severe AS, chronic atrial fib, chronic diastolic CHF, HTN, hypothyroidism, depression and chronic fatigue who presents to clinic for posthospital follow-up.  Severe AS s/p TAVR: 06/27/15 she underwent successful TAVR via Percutaneous Transfemoral Approach with Edwards Sapien 3 THV (size 23 mm, model # 9600TFX, serial # [id number]). Follow-up with Dr. Burt Knack w/ 2D ECHO in 34 month as previously scheduled.   Chronic atrial fib: HR 96. Continue Eliquis 5 mg twice a day. Rate better controlled on increased Cardizem CD ( 240mg  daily). CHADSVASC score of 5 (CHF1, HTN1, age 30, Female sex 1) .  Chronic diastolic CHF: Appears euvolemic. Continue home dose of Lasix 20 mg daily.  HTN: BP better controlled today on increased Cardizem 240mg  daily : 140/89 today.   Current medicines are reviewed at length with the patient today.  The patient does not have concerns regarding medicines.  The following changes have been made:  no change  Labs/ tests ordered today include:   No orders of the defined types were placed in this encounter.     Disposition:   FU with Dr. Burt Knack and Dr. Stanford Mack as previously scheduled.   Renea Ee  07/12/2015 2:52 AM    [facility] [place], [place], [place]  [address] Phone: [phone]; Fax: [phone]

## 2015-07-12 NOTE — Patient Instructions (Signed)
Medication Instructions:  Your physician recommends that you continue on your current medications as directed. Please refer to the Current Medication list given to you today.  Labwork: None ordered  Testing/Procedures: None ordered  Follow-Up: Your physician recommends that you keep your scheduled follow-up appointment WITH DR. Burt Knack AS PLANNED   Any Other Special Instructions Will Be Listed Below (If Applicable).   If you need a refill on your cardiac medications before your next appointment, please call your pharmacy.

## 2015-07-21 NOTE — Progress Notes (Signed)
HPI: FU atrial fibrillation and AS now s/p TAVR. Holter monitor in March of 2015 showed atrial fibrillation with rate controlled. Echo repeated 12/16 and showed normal LV function and severe AS with mean gradient 54 mmHg. Cath 1/17 showed no obstructive CAD. Had TAVR 2/17. Echo 06/28/15 showed probable normal LV function, s/p AVR with no AI and mean gradient 9 mmHg, mild MR, severe LAE. Since last seen,   Current Outpatient Prescriptions  Medication Sig Dispense Refill  . acetaminophen (TYLENOL) 500 MG tablet Take 1,000 mg by mouth 3 (three) times daily as needed for mild pain or headache (for arthritis pain).     Marland Kitchen diltiazem (CARDIZEM CD) 240 MG 24 hr capsule Take 1 capsule (240 mg total) by mouth daily. 90 capsule 1  . ELIQUIS 5 MG TABS tablet Take 5 mg by mouth 2 (two) times daily.     . fluticasone (FLONASE) 50 MCG/ACT nasal spray Place 2 sprays into both nostrils daily. Reported on 05/29/2015    . furosemide (LASIX) 20 MG tablet Take 20 mg by mouth daily.     Marland Kitchen levothyroxine (SYNTHROID, LEVOTHROID) 50 MCG tablet Take 50 mcg by mouth as directed. Tuesdays, Thursdays, Saturdays, Sundays    . levothyroxine (SYNTHROID, LEVOTHROID) 75 MCG tablet Take 75 mcg by mouth as directed. Mondays, Wednesdays, and Fridays    . losartan (COZAAR) 50 MG tablet TAKE ONE (1) TABLET BY MOUTH EVERY DAY 90 tablet 1  . Omega-3 Fatty Acids (FISH OIL) 1000 MG CAPS Take 1,000 mg by mouth daily as needed (for supplementation).     . tretinoin (RETIN-A) 0.05 % cream Apply 1 application topically daily as needed (for skin). Apply a small amount to skin daily    . venlafaxine XR (EFFEXOR-XR) 37.5 MG 24 hr capsule Take 37.5 mg by mouth daily.      No current facility-administered medications for this visit.     Past Medical History  Diagnosis Date  . Chronic atrial fibrillation ([place])   . Hyperlipidemia   . Essential hypertension   . ANEMIA NOS   . BICEPS TENDON RUPTURE, LEFT   . Closed anterior dislocation of  humerus   . DEPRESSION, MAJOR, RECURRENT   . FATIGUE, CHRONIC   . Hypothyroidism   . Obesity   . OSTEOARTHRITIS, MULTI SITES   . Rosacea   . SUBACROMIAL BURSITIS, RIGHT   . Breast cancer ([place])   . Chronic diastolic (congestive) heart failure ([place])   . Chronic atrial fibrillation ([place]) 07/17/2006    Qualifier: Diagnosis of  By: Oneida Alar MD, KARL    . Severe aortic stenosis 06/24/2013    43 mmHg mean gradient Aug 2016   . Mild CAD     a. Mild nonobstructive CAD by cath 05/2015.  . Mitral regurgitation     a. Mild MR by echo after TAVR 06/2015.  . Pulmonary HTN ([place])     a. Moderate by studies in 2017.    Past Surgical History  Procedure Laterality Date  . Mastectomy Right   . Tonsillectomy    . Abdominal hysterectomy    . Cardiac catheterization N/A 05/15/2015    Procedure: Right/Left Heart Cath and Coronary Angiography;  Surgeon: Sherren Mocha, MD;  Location: [facility];  Service: Cardiovascular;  Laterality: N/A;  . Transcatheter aortic valve replacement, transfemoral N/A 06/27/2015    Procedure: TRANSCATHETER AORTIC VALVE REPLACEMENT, TRANSFEMORAL;  Surgeon: Sherren Mocha, MD;  Location: [facility];  Service: Open Heart Surgery;  Laterality: N/A;  .  Tee without cardioversion N/A 06/27/2015    Procedure: TRANSESOPHAGEAL ECHOCARDIOGRAM (TEE);  Surgeon: Sherren Mocha, MD;  Location: [facility];  Service: Open Heart Surgery;  Laterality: N/A;    Social History   Social History  . Marital Status: Married    Spouse Name: N/A  . Number of Children: 2  . Years of Education: N/A   Occupational History  .     Social History Main Topics  . Smoking status: Never Smoker   . Smokeless tobacco: Not on file  . Alcohol Use: Yes     Comment: 3 drinks per night  . Drug Use: Not on file  . Sexual Activity: Not on file   Other Topics Concern  . Not on file   Social History Narrative    Family History  Problem Relation Age of Onset  . CAD Mother   . Heart attack Maternal Uncle   .  Hypertension Neg Hx   . Stroke Neg Hx     ROS: no fevers or chills, productive cough, hemoptysis, dysphasia, odynophagia, melena, hematochezia, dysuria, hematuria, rash, seizure activity, orthopnea, PND, pedal edema, claudication. Remaining systems are negative.  Physical Exam: Well-developed well-nourished in no acute distress.  Skin is warm and dry.  HEENT is normal.  Neck is supple.  Chest is clear to auscultation with normal expansion.  Cardiovascular exam is regular rate and rhythm.  Abdominal exam nontender or distended. No masses palpated. Extremities show no edema. neuro grossly intact  ECG     This encounter was created in error - please disregard.

## 2015-07-28 ENCOUNTER — Encounter: Payer: Medicare Other | Admitting: Cardiology

## 2015-07-28 ENCOUNTER — Other Ambulatory Visit: Payer: Self-pay

## 2015-07-28 ENCOUNTER — Ambulatory Visit (INDEPENDENT_AMBULATORY_CARE_PROVIDER_SITE_OTHER): Payer: Medicare Other | Admitting: Cardiovascular Disease

## 2015-07-28 ENCOUNTER — Ambulatory Visit (HOSPITAL_COMMUNITY): Payer: Medicare Other | Attending: Cardiovascular Disease

## 2015-07-28 ENCOUNTER — Encounter: Payer: Self-pay | Admitting: Cardiovascular Disease

## 2015-07-28 VITALS — BP 160/80 | HR 84 | Ht 62.0 in | Wt 171.2 lb

## 2015-07-28 DIAGNOSIS — Z954 Presence of other heart-valve replacement: Secondary | ICD-10-CM

## 2015-07-28 DIAGNOSIS — I350 Nonrheumatic aortic (valve) stenosis: Secondary | ICD-10-CM | POA: Diagnosis not present

## 2015-07-28 DIAGNOSIS — Z952 Presence of prosthetic heart valve: Secondary | ICD-10-CM

## 2015-07-28 MED ORDER — DILTIAZEM HCL ER COATED BEADS 240 MG PO CP24: 240.0000 mg | ORAL_CAPSULE | Freq: Every day | ORAL | Status: DC

## 2015-07-28 NOTE — Patient Instructions (Signed)

## 2015-07-28 NOTE — Progress Notes (Signed)
Cardiology Office Note Date:  07/29/2015   ID:  Tara Mack, Tara Mack 05-29-1930, MRN [id number]  PCP:  Mathews Argyle, MD  Cardiologist:  Sherren Mocha, MD    Chief Complaint  Patient presents with  . Aortic Stenosis   History of Present Illness: Tara Mack is a 82 y.o. female who presents for Follow-up evaluation after TAVR. She has been followed long-term for aortic stenosis and has developed progressive symptoms. She underwent TAVR [facility] percutaneous transfemoral approach 06/27/2015. She had an uncomplicated early postoperative course. The patient also has chronic atrial fibrillation and has been treated with Eliquis which was resumed after TAVR.  The patient is doing okay. She is pretty limited by knee pain and leg weakness. She denies shortness of breath with activity. She's had no chest pain. She complains of easy bruising on Eliquis.  Past Medical History  Diagnosis Date  . Chronic atrial fibrillation ([place])   . Hyperlipidemia   . Essential hypertension   . ANEMIA NOS   . BICEPS TENDON RUPTURE, LEFT   . Closed anterior dislocation of humerus   . DEPRESSION, MAJOR, RECURRENT   . FATIGUE, CHRONIC   . Hypothyroidism   . Obesity   . OSTEOARTHRITIS, MULTI SITES   . Rosacea   . SUBACROMIAL BURSITIS, RIGHT   . Breast cancer ([place])   . Chronic diastolic (congestive) heart failure ([place])   . Chronic atrial fibrillation ([place]) 07/17/2006    Qualifier: Diagnosis of  By: Oneida Alar MD, KARL    . Severe aortic stenosis 06/24/2013    43 mmHg mean gradient Aug 2016   . Mild CAD     a. Mild nonobstructive CAD by cath 05/2015.  . Mitral regurgitation     a. Mild MR by echo after TAVR 06/2015.  . Pulmonary HTN ([place])     a. Moderate by studies in 2017.    Past Surgical History  Procedure Laterality Date  . Mastectomy Right   . Tonsillectomy    . Abdominal hysterectomy    . Cardiac catheterization N/A 05/15/2015    Procedure: Right/Left Heart Cath and Coronary Angiography;  Surgeon:  Sherren Mocha, MD;  Location: [facility];  Service: Cardiovascular;  Laterality: N/A;  . Transcatheter aortic valve replacement, transfemoral N/A 06/27/2015    Procedure: TRANSCATHETER AORTIC VALVE REPLACEMENT, TRANSFEMORAL;  Surgeon: Sherren Mocha, MD;  Location: [facility];  Service: Open Heart Surgery;  Laterality: N/A;  . Tee without cardioversion N/A 06/27/2015    Procedure: TRANSESOPHAGEAL ECHOCARDIOGRAM (TEE);  Surgeon: Sherren Mocha, MD;  Location: [facility];  Service: Open Heart Surgery;  Laterality: N/A;    Current Outpatient Prescriptions  Medication Sig Dispense Refill  . acetaminophen (TYLENOL) 500 MG tablet Take 1,000 mg by mouth 3 (three) times daily as needed for mild pain or headache (for arthritis pain).     Marland Kitchen diltiazem (CARDIZEM CD) 240 MG 24 hr capsule Take 1 capsule (240 mg total) by mouth daily. 90 capsule 3  . ELIQUIS 5 MG TABS tablet Take 5 mg by mouth 2 (two) times daily.     . fluticasone (FLONASE) 50 MCG/ACT nasal spray Place 2 sprays into both nostrils daily. Reported on 05/29/2015    . furosemide (LASIX) 20 MG tablet Take 20 mg by mouth daily.     Marland Kitchen levothyroxine (SYNTHROID, LEVOTHROID) 50 MCG tablet Take 50 mcg by mouth as directed. Tuesdays, Thursdays, Saturdays, Sundays    . levothyroxine (SYNTHROID, LEVOTHROID) 75 MCG tablet Take 75 mcg by mouth as directed. Mondays,  Wednesdays, and Fridays    . losartan (COZAAR) 50 MG tablet TAKE ONE (1) TABLET BY MOUTH EVERY DAY 90 tablet 1  . Omega-3 Fatty Acids (FISH OIL) 1000 MG CAPS Take 1,000 mg by mouth daily as needed (for supplementation).     . tretinoin (RETIN-A) 0.05 % cream Apply 1 application topically daily as needed (for skin). Apply a small amount to skin daily    . venlafaxine XR (EFFEXOR-XR) 37.5 MG 24 hr capsule Take 37.5 mg by mouth daily.      No current facility-administered medications for this visit.    Allergies:   Review of patient's allergies indicates no known allergies.   Social History:  The  patient  reports that she has never smoked. She does not have any smokeless tobacco history on file. She reports that she drinks alcohol.   Family History:  The patient's  family history includes CAD in her mother; Heart attack in her maternal uncle. There is no history of Hypertension or Stroke.    ROS:  Please see the history of present illness.  Otherwise, review of systems is positive for Leg plane, snoring, balance problems easy bruising.  All other systems are reviewed and negative.    PHYSICAL EXAM: VS:  BP 160/80 mmHg  Pulse 84  Ht 5\' 2"  (1.575 m)  Wt 171 lb 3.2 oz (77.656 kg)  BMI 31.31 kg/m2 , BMI Body mass index is 31.31 kg/(m^2). GEN: Well nourished, well developed, in no acute distress HEENT: normal Neck: no JVD, no masses. No carotid bruits Cardiac: irregularly irregular with soft SEM at the RUSB, no diastolic murmur             Respiratory:  clear to auscultation bilaterally, normal work of breathing GI: soft, nontender, nondistended, + BS MS: no deformity or atrophy Ext: no pretibial edema Skin: warm and dry, ecchymoses are present on the arms and legs Neuro:  Strength and sensation are intact Psych: euthymic mood, full affect  EKG:  EKG is not ordered today.  Recent Labs: 06/23/2015: ALT 11* 06/28/2015: Magnesium 1.8 06/29/2015: BUN 8; Creatinine, Ser 0.55; Hemoglobin 12.4; Platelets 137*; Potassium 3.7; Sodium 138   Lipid Panel     Component Value Date/Time   CHOL 243* 07/17/2006 2053      Wt Readings from Last 3 Encounters:  07/28/15 171 lb 3.2 oz (77.656 kg)  07/12/15 174 lb 12.8 oz (79.289 kg)  07/06/15 175 lb 9.6 oz (79.652 kg)     Cardiac Studies Reviewed: 2D Echo: Left ventricle: The cavity size was normal. Wall thickness was increased in a pattern of mild LVH. Systolic function was normal. The estimated ejection fraction was in the range of 60% to 65%.  ------------------------------------------------------------------- Aortic valve: s/p  TAVR Not well visualized. A bioprosthesis was present. Doppler: There was no stenosis. There was no regurgitation. VTI ratio of LVOT to aortic valve: 0.39. Peak velocity ratio of LVOT to aortic valve: 0.37. Mean velocity ratio of LVOT to aortic valve: 0.36. Mean gradient (S): 12 mm Hg. Peak gradient (S): 24 mm Hg.  ------------------------------------------------------------------- Aorta: Aortic root: The aortic root was normal in size. Ascending aorta: The ascending aorta was normal in size.  ------------------------------------------------------------------- Mitral valve: Mildly thickened leaflets . Doppler: There was trivial regurgitation. Peak gradient (D): 13 mm Hg.  ------------------------------------------------------------------- Left atrium: The atrium was severely dilated.  ------------------------------------------------------------------- Right ventricle: The cavity size was normal. Systolic function was normal.  ------------------------------------------------------------------- Pulmonic valve: The valve appears to be grossly normal. Doppler: There  was no significant regurgitation.  ------------------------------------------------------------------- Tricuspid valve: The valve appears to be grossly normal. Doppler: There was mild regurgitation.  ------------------------------------------------------------------- Pulmonary artery: Systolic pressure was moderately increased.  ------------------------------------------------------------------- Right atrium: The atrium was moderately dilated.  ------------------------------------------------------------------- Pericardium: There was no pericardial effusion.   ASSESSMENT AND PLAN: 1.  Severe AS s/p TAVR: the patient is doing well. I have reviewed her echo and this shows normal transaortic gradients and no PVL. She is primarily limited by generalized leg weakness with minimal  cardiac symptoms, likely NYHA II. Continue current Rx with Eliquis. We discussed the addition of low-dose ASA but she bruises badly and we decided to continue with Eliquis alone to minimize bleeding risk.   2. Atrial fibrillation, chronic: tolerating anticoagulation. Continue diltiazem 240 mg daily.  Current medicines are reviewed with the patient today.  The patient does not have concerns regarding medicines.  Labs/ tests ordered today include:  No orders of the defined types were placed in this encounter.    Disposition:   FU Dr Stanford Breed 3 months. Valve clinic FU with an echo in one year.   Deatra James, MD  07/29/2015 8:35 AM    [address], [place], [place]  [address] Phone: [phone]; Fax: [phone]

## 2015-10-23 NOTE — Progress Notes (Signed)
HPI: FU TAVR; Also with atrial fibrillation treated with rate control and anticoagulation. Holter monitor in March of 2015 showed atrial fibrillation with rate controlled. Patient had cardiac catheterization prior to TAVR 1/17-nonobstructive coronary disease and moderate pulmonary hypertension. Had TAVR 2/17 with Edwards Sapien valve. Follow-up echocardiogram March 2017 showed normal LV systolic function, biatrial enlargement, bioprosthetic aortic valve with mean gradient 12 mmHg and no aortic insufficiency, moderate pulmonary hypertension. Since last seen, the patient denies any dyspnea on exertion, orthopnea, PND, pedal edema, palpitations, syncope or chest pain.   Current Outpatient Prescriptions  Medication Sig Dispense Refill  . acetaminophen (TYLENOL) 500 MG tablet Take 1,000 mg by mouth 3 (three) times daily as needed for mild pain or headache (for arthritis pain).     Marland Kitchen diltiazem (CARDIZEM CD) 240 MG 24 hr capsule Take 1 capsule (240 mg total) by mouth daily. 90 capsule 3  . ELIQUIS 5 MG TABS tablet Take 5 mg by mouth 2 (two) times daily.     . fluticasone (FLONASE) 50 MCG/ACT nasal spray Place 2 sprays into both nostrils daily. Reported on 05/29/2015    . furosemide (LASIX) 20 MG tablet Take 20 mg by mouth daily.     Marland Kitchen levothyroxine (SYNTHROID, LEVOTHROID) 50 MCG tablet Take 50 mcg by mouth as directed. Tuesdays, Thursdays, Saturdays, Sundays    . levothyroxine (SYNTHROID, LEVOTHROID) 75 MCG tablet Take 75 mcg by mouth as directed. Mondays, Wednesdays, and Fridays    . losartan (COZAAR) 50 MG tablet TAKE ONE (1) TABLET BY MOUTH EVERY DAY 90 tablet 1  . Omega-3 Fatty Acids (FISH OIL) 1000 MG CAPS Take 1,000 mg by mouth daily as needed (for supplementation).     . tretinoin (RETIN-A) 0.05 % cream Apply 1 application topically daily as needed (for skin). Apply a small amount to skin daily    . venlafaxine XR (EFFEXOR-XR) 37.5 MG 24 hr capsule Take 37.5 mg by mouth daily.      No  current facility-administered medications for this visit.     Past Medical History  Diagnosis Date  . Chronic atrial fibrillation ([place])   . Hyperlipidemia   . Essential hypertension   . ANEMIA NOS   . BICEPS TENDON RUPTURE, LEFT   . Closed anterior dislocation of humerus   . DEPRESSION, MAJOR, RECURRENT   . FATIGUE, CHRONIC   . Hypothyroidism   . Obesity   . OSTEOARTHRITIS, MULTI SITES   . Rosacea   . SUBACROMIAL BURSITIS, RIGHT   . Breast cancer ([place])   . Chronic diastolic (congestive) heart failure ([place])   . Chronic atrial fibrillation ([place]) 07/17/2006    Qualifier: Diagnosis of  By: Oneida Alar MD, KARL    . Severe aortic stenosis 06/24/2013    43 mmHg mean gradient Aug 2016   . Mild CAD     a. Mild nonobstructive CAD by cath 05/2015.  . Mitral regurgitation     a. Mild MR by echo after TAVR 06/2015.  . Pulmonary HTN ([place])     a. Moderate by studies in 2017.    Past Surgical History  Procedure Laterality Date  . Mastectomy Right   . Tonsillectomy    . Abdominal hysterectomy    . Cardiac catheterization N/A 05/15/2015    Procedure: Right/Left Heart Cath and Coronary Angiography;  Surgeon: Sherren Mocha, MD;  Location: [facility];  Service: Cardiovascular;  Laterality: N/A;  . Transcatheter aortic valve replacement, transfemoral N/A 06/27/2015    Procedure: TRANSCATHETER AORTIC  VALVE REPLACEMENT, TRANSFEMORAL;  Surgeon: Sherren Mocha, MD;  Location: [facility];  Service: Open Heart Surgery;  Laterality: N/A;  . Tee without cardioversion N/A 06/27/2015    Procedure: TRANSESOPHAGEAL ECHOCARDIOGRAM (TEE);  Surgeon: Sherren Mocha, MD;  Location: [facility];  Service: Open Heart Surgery;  Laterality: N/A;    Social History   Social History  . Marital Status: Married    Spouse Name: N/A  . Number of Children: 2  . Years of Education: N/A   Occupational History  .     Social History Main Topics  . Smoking status: Never Smoker   . Smokeless tobacco: Not on file  . Alcohol  Use: Yes     Comment: 3 drinks per night  . Drug Use: Not on file  . Sexual Activity: Not on file   Other Topics Concern  . Not on file   Social History Narrative    Family History  Problem Relation Age of Onset  . CAD Mother   . Heart attack Maternal Uncle   . Hypertension Neg Hx   . Stroke Neg Hx     ROS: no fevers or chills, productive cough, hemoptysis, dysphasia, odynophagia, melena, hematochezia, dysuria, hematuria, rash, seizure activity, orthopnea, PND, pedal edema, claudication. Remaining systems are negative.  Physical Exam: Well-developed obese in no acute distress.  Skin is warm and dry.  HEENT is normal.  Neck is supple.  Chest is clear to auscultation with normal expansion.  Cardiovascular exam is irregular Abdominal exam nontender or distended. No masses palpated. Extremities show no edema. neuro grossly intact

## 2015-10-31 ENCOUNTER — Ambulatory Visit (INDEPENDENT_AMBULATORY_CARE_PROVIDER_SITE_OTHER): Payer: Medicare Other | Admitting: Cardiology

## 2015-10-31 ENCOUNTER — Encounter: Payer: Self-pay | Admitting: Cardiology

## 2015-10-31 VITALS — BP 130/68 | HR 69 | Ht 62.0 in | Wt 173.0 lb

## 2015-10-31 DIAGNOSIS — I482 Chronic atrial fibrillation, unspecified: Secondary | ICD-10-CM

## 2015-10-31 DIAGNOSIS — I10 Essential (primary) hypertension: Secondary | ICD-10-CM

## 2015-10-31 DIAGNOSIS — I5032 Chronic diastolic (congestive) heart failure: Secondary | ICD-10-CM

## 2015-10-31 DIAGNOSIS — E785 Hyperlipidemia, unspecified: Secondary | ICD-10-CM

## 2015-10-31 DIAGNOSIS — Z954 Presence of other heart-valve replacement: Secondary | ICD-10-CM | POA: Diagnosis not present

## 2015-10-31 DIAGNOSIS — E669 Obesity, unspecified: Secondary | ICD-10-CM | POA: Diagnosis not present

## 2015-10-31 DIAGNOSIS — Z952 Presence of prosthetic heart valve: Secondary | ICD-10-CM

## 2015-10-31 LAB — CBC
HCT: 42.8 % (ref 35.0–45.0)
Hemoglobin: 14.5 g/dL (ref 11.7–15.5)
MCH: 33 pg (ref 27.0–33.0)
MCHC: 33.9 g/dL (ref 32.0–36.0)
MCV: 97.3 fL (ref 80.0–100.0)
MPV: 10.2 fL (ref 7.5–12.5)
Platelets: 205 10*3/uL (ref 140–400)
RBC: 4.4 MIL/uL (ref 3.80–5.10)
RDW: 14.2 % (ref 11.0–15.0)
WBC: 7.2 10*3/uL (ref 3.8–10.8)

## 2015-10-31 NOTE — Assessment & Plan Note (Signed)
Continue present dose of Lasix. Check potassium and renal function. 

## 2015-10-31 NOTE — Assessment & Plan Note (Signed)
Management per primary care. 

## 2015-10-31 NOTE — Patient Instructions (Signed)
Your physician wants you to follow-up in: 6 MONTHS WITH DR CRENSHAW You will receive a reminder letter in the mail two months in advance. If you don't receive a letter, please call our office to schedule the follow-up appointment.   Your physician recommends that you HAVE LAB WORK TODAY 

## 2015-10-31 NOTE — Assessment & Plan Note (Addendum)
Patient remains in permanent atrial fibrillation. Continue Cardizem for rate control. Continue apixaban. Check hemoglobin and renal function. 

## 2015-10-31 NOTE — Assessment & Plan Note (Signed)
Discussed need for weight loss. 

## 2015-10-31 NOTE — Assessment & Plan Note (Signed)
Continue SBE prophylaxis. 

## 2015-10-31 NOTE — Assessment & Plan Note (Signed)
Blood pressure controlled. Continue present medications. 

## 2015-11-01 LAB — BASIC METABOLIC PANEL
BUN: 13 mg/dL (ref 7–25)
CHLORIDE: 99 mmol/L (ref 98–110)
CO2: 27 mmol/L (ref 20–31)
Calcium: 9.2 mg/dL (ref 8.6–10.4)
Creat: 0.69 mg/dL (ref 0.60–0.88)
Glucose, Bld: 89 mg/dL (ref 65–99)
POTASSIUM: 4.2 mmol/L (ref 3.5–5.3)
Sodium: 136 mmol/L (ref 135–146)

## 2015-11-23 ENCOUNTER — Telehealth: Payer: Self-pay | Admitting: Cardiology

## 2015-11-23 NOTE — Telephone Encounter (Signed)
She wants to know what does patient need to be pre medicated for when having dental work? Will she need this if she has a cleaning,root canal,crown and filling?

## 2015-11-23 NOTE — Telephone Encounter (Signed)
Returned call to [place] with Dr.Ribando's office earlier today,office closed for lunch.

## 2015-11-24 NOTE — Telephone Encounter (Signed)
Left message for Tara Mack, pt does require SBE, she is s/p TAVR.

## 2015-12-14 ENCOUNTER — Other Ambulatory Visit: Payer: Self-pay

## 2015-12-14 MED ORDER — LOSARTAN POTASSIUM 50 MG PO TABS
50.0000 mg | ORAL_TABLET | Freq: Every day | ORAL | 1 refills | Status: DC
Start: 2015-12-14 — End: 2016-06-12

## 2016-03-12 ENCOUNTER — Other Ambulatory Visit: Payer: Self-pay

## 2016-03-12 DIAGNOSIS — I350 Nonrheumatic aortic (valve) stenosis: Secondary | ICD-10-CM

## 2016-03-12 DIAGNOSIS — Z952 Presence of prosthetic heart valve: Secondary | ICD-10-CM

## 2016-05-16 NOTE — Progress Notes (Deleted)
HPI: FU TAVR; Also with atrial fibrillation treated with rate control and anticoagulation. Holter monitor in March of 2015 showed atrial fibrillation with rate controlled. Patient had cardiac catheterization prior to TAVR 1/17-nonobstructive coronary disease and moderate pulmonary hypertension. Had TAVR 2/17 with Edwards Sapien valve. Follow-up echocardiogram March 2017 showed normal LV systolic function, biatrial enlargement, bioprosthetic aortic valve with mean gradient 12 mmHg and no aortic insufficiency, moderate pulmonary hypertension. Since last seen,   Current Outpatient Prescriptions  Medication Sig Dispense Refill  . acetaminophen (TYLENOL) 500 MG tablet Take 1,000 mg by mouth 3 (three) times daily as needed for mild pain or headache (for arthritis pain).     Marland Kitchen diltiazem (CARDIZEM CD) 240 MG 24 hr capsule Take 1 capsule (240 mg total) by mouth daily. 90 capsule 3  . ELIQUIS 5 MG TABS tablet Take 5 mg by mouth 2 (two) times daily.     . fluticasone (FLONASE) 50 MCG/ACT nasal spray Place 2 sprays into both nostrils daily. Reported on 05/29/2015    . furosemide (LASIX) 20 MG tablet Take 20 mg by mouth daily.     Marland Kitchen levothyroxine (SYNTHROID, LEVOTHROID) 50 MCG tablet Take 50 mcg by mouth as directed. Tuesdays, Thursdays, Saturdays, Sundays    . levothyroxine (SYNTHROID, LEVOTHROID) 75 MCG tablet Take 75 mcg by mouth as directed. Mondays, Wednesdays, and Fridays    . losartan (COZAAR) 50 MG tablet Take 1 tablet (50 mg total) by mouth daily. 90 tablet 1  . Omega-3 Fatty Acids (FISH OIL) 1000 MG CAPS Take 1,000 mg by mouth daily as needed (for supplementation).     . tretinoin (RETIN-A) 0.05 % cream Apply 1 application topically daily as needed (for skin). Apply a small amount to skin daily    . venlafaxine XR (EFFEXOR-XR) 37.5 MG 24 hr capsule Take 37.5 mg by mouth daily.      No current facility-administered medications for this visit.      Past Medical History:  Diagnosis Date  .  ANEMIA NOS   . BICEPS TENDON RUPTURE, LEFT   . Breast cancer ([place])   . Chronic atrial fibrillation ([place])   . Chronic atrial fibrillation ([place]) 07/17/2006   Qualifier: Diagnosis of  By: Oneida Alar MD, KARL    . Chronic diastolic (congestive) heart failure ([place])   . Closed anterior dislocation of humerus   . DEPRESSION, MAJOR, RECURRENT   . Essential hypertension   . FATIGUE, CHRONIC   . Hyperlipidemia   . Hypothyroidism   . Mild CAD    a. Mild nonobstructive CAD by cath 05/2015.  . Mitral regurgitation    a. Mild MR by echo after TAVR 06/2015.  . Obesity   . OSTEOARTHRITIS, MULTI SITES   . Pulmonary HTN    a. Moderate by studies in 2017.  Marland Kitchen Rosacea   . Severe aortic stenosis 06/24/2013   43 mmHg mean gradient Aug 2016   . SUBACROMIAL BURSITIS, RIGHT     Past Surgical History:  Procedure Laterality Date  . ABDOMINAL HYSTERECTOMY    . CARDIAC CATHETERIZATION N/A 05/15/2015   Procedure: Right/Left Heart Cath and Coronary Angiography;  Surgeon: Sherren Mocha, MD;  Location: [facility];  Service: Cardiovascular;  Laterality: N/A;  . MASTECTOMY Right   . TEE WITHOUT CARDIOVERSION N/A 06/27/2015   Procedure: TRANSESOPHAGEAL ECHOCARDIOGRAM (TEE);  Surgeon: Sherren Mocha, MD;  Location: [facility];  Service: Open Heart Surgery;  Laterality: N/A;  . TONSILLECTOMY    . TRANSCATHETER AORTIC VALVE REPLACEMENT,  TRANSFEMORAL N/A 06/27/2015   Procedure: TRANSCATHETER AORTIC VALVE REPLACEMENT, TRANSFEMORAL;  Surgeon: Sherren Mocha, MD;  Location: [facility];  Service: Open Heart Surgery;  Laterality: N/A;    Social History   Social History  . Marital status: Married    Spouse name: N/A  . Number of children: 2  . Years of education: N/A   Occupational History  .  Retired   Social History Main Topics  . Smoking status: Never Smoker  . Smokeless tobacco: Not on file  . Alcohol use Yes     Comment: 3 drinks per night  . Drug use: Unknown  . Sexual activity: Not on file   Other Topics  Concern  . Not on file   Social History Narrative  . No narrative on file    Family History  Problem Relation Age of Onset  . CAD Mother   . Heart attack Maternal Uncle   . Hypertension Neg Hx   . Stroke Neg Hx     ROS: no fevers or chills, productive cough, hemoptysis, dysphasia, odynophagia, melena, hematochezia, dysuria, hematuria, rash, seizure activity, orthopnea, PND, pedal edema, claudication. Remaining systems are negative.  Physical Exam: Well-developed well-nourished in no acute distress.  Skin is warm and dry.  HEENT is normal.  Neck is supple.  Chest is clear to auscultation with normal expansion.  Cardiovascular exam is regular rate and rhythm.  Abdominal exam nontender or distended. No masses palpated. Extremities show no edema. neuro grossly intact  ECG

## 2016-05-27 ENCOUNTER — Ambulatory Visit: Payer: Medicare Other | Admitting: Cardiology

## 2016-06-12 ENCOUNTER — Other Ambulatory Visit (HOSPITAL_COMMUNITY): Payer: Medicare Other

## 2016-06-12 ENCOUNTER — Other Ambulatory Visit: Payer: Self-pay

## 2016-06-12 ENCOUNTER — Ambulatory Visit: Payer: Medicare Other | Admitting: Cardiovascular Disease

## 2016-06-12 MED ORDER — LOSARTAN POTASSIUM 50 MG PO TABS
50.0000 mg | ORAL_TABLET | Freq: Every day | ORAL | 1 refills | Status: DC
Start: 2016-06-12 — End: 2016-10-25

## 2016-07-09 ENCOUNTER — Telehealth: Payer: Self-pay

## 2016-07-09 NOTE — Telephone Encounter (Signed)
Pt due for 1 YEAR TAVR follow-up. This was previously scheduled on 06/12/2016 but the patient cancelled Echocardiogram and OV due to her spouse passing away recently per appointment comments.  I left a voicemail today for the pt to contact our office to have these appointments rescheduled.  I instructed her to ask for me so that I can work her into Dr [facility] schedule.

## 2016-07-15 NOTE — Telephone Encounter (Signed)
I spoke with the pt and scheduled her for Echo and 1 year TAVR follow-up on 08/16/16.

## 2016-08-01 ENCOUNTER — Encounter: Payer: Self-pay | Admitting: Cardiovascular Disease

## 2016-08-16 ENCOUNTER — Encounter: Payer: Self-pay | Admitting: Cardiovascular Disease

## 2016-08-16 ENCOUNTER — Other Ambulatory Visit: Payer: Self-pay

## 2016-08-16 ENCOUNTER — Ambulatory Visit (INDEPENDENT_AMBULATORY_CARE_PROVIDER_SITE_OTHER): Payer: Medicare Other | Admitting: Cardiovascular Disease

## 2016-08-16 ENCOUNTER — Ambulatory Visit (HOSPITAL_COMMUNITY): Payer: Medicare Other | Attending: Cardiovascular Disease

## 2016-08-16 VITALS — BP 140/80 | HR 86 | Ht 63.0 in | Wt 159.1 lb

## 2016-08-16 DIAGNOSIS — I350 Nonrheumatic aortic (valve) stenosis: Secondary | ICD-10-CM | POA: Insufficient documentation

## 2016-08-16 DIAGNOSIS — I517 Cardiomegaly: Secondary | ICD-10-CM | POA: Diagnosis not present

## 2016-08-16 DIAGNOSIS — Z952 Presence of prosthetic heart valve: Secondary | ICD-10-CM | POA: Insufficient documentation

## 2016-08-16 DIAGNOSIS — I361 Nonrheumatic tricuspid (valve) insufficiency: Secondary | ICD-10-CM | POA: Insufficient documentation

## 2016-08-16 NOTE — Patient Instructions (Signed)
Medication Instructions:  Your physician recommends that you continue on your current medications as directed. Please refer to the Current Medication list given to you today.  Labwork: No new orders.   Testing/Procedures: No new orders.   Follow-Up: Your physician recommends that you schedule a follow-up appointment as needed with Dr Stanford Breed.    Any Other Special Instructions Will Be Listed Below (If Applicable).     If you need a refill on your cardiac medications before your next appointment, please call your pharmacy.

## 2016-08-16 NOTE — Progress Notes (Signed)
Cardiology Office Note Date:  08/16/2016   ID:  [place], DOB 1930/05/07, MRN [id number]  PCP:  [place]ara Argyle, MD  Cardiologist:  [place]ara Mocha, MD    Chief Complaint  Patient presents with  . Aortic Stenosis    severe s/p TAVR     History of Present Illness: [place] is a 83 y.o. female who presents for one year TAVR follow-up.   She underwent TAVR via a percutaneous transfemoral approach 06/27/2015 with a 23 mm Sapien 3 transcatheter heart valve.   She is here alone today. Her husband died unexpectedly with acute leukemia. She is moving to [place] in the near future to be close to family. From a symptomatic perspective she is doing ok. No chest pain, shortness of breath, or leg swelling. She is very limited from bilateral knee pain. Needs to have knee replacement surgery but plans to wait until her move to consider this. Ambulates with a walker.   Past Medical History:  Diagnosis Date  . ANEMIA NOS   . BICEPS TENDON RUPTURE, LEFT   . Breast cancer ([place])   . Chronic atrial fibrillation ([facility])   . Chronic atrial fibrillation ([place]) 07/17/2006   Qualifier: Diagnosis of  By: Oneida Alar MD, [place]    . Chronic diastolic (congestive) heart failure   . Closed anterior dislocation of humerus   . DEPRESSION, MAJOR, RECURRENT   . Essential hypertension   . FATIGUE, CHRONIC   . Hyperlipidemia   . Hypothyroidism   . Mild CAD    a. Mild nonobstructive CAD by cath 05/2015.  . Mitral regurgitation    a. Mild MR by echo after TAVR 06/2015.  . Obesity   . OSTEOARTHRITIS, MULTI SITES   . Pulmonary HTN    a. Moderate by studies in 2017.  Marland Kitchen Rosacea   . Severe aortic stenosis 06/24/2013   43 mmHg mean gradient Aug 2016   . SUBACROMIAL BURSITIS, RIGHT     Past Surgical History:  Procedure Laterality Date  . ABDOMINAL HYSTERECTOMY    . CARDIAC CATHETERIZATION N/A 05/15/2015   Procedure: Right/Left Heart Cath and Coronary Angiography;  Surgeon: [place] Mocha, MD;  Location: [facility];  Service: Cardiovascular;  Laterality: N/A;  . MASTECTOMY Right   . TEE WITHOUT CARDIOVERSION N/A 06/27/2015   Procedure: TRANSESOPHAGEAL ECHOCARDIOGRAM (TEE);  Surgeon: [place] Mocha, MD;  Location: [facility];  Service: Open Heart Surgery;  Laterality: N/A;  . TONSILLECTOMY    . TRANSCATHETER AORTIC VALVE REPLACEMENT, TRANSFEMORAL N/A 06/27/2015   Procedure: TRANSCATHETER AORTIC VALVE REPLACEMENT, TRANSFEMORAL;  Surgeon: [place] Mocha, MD;  Location: [facility];  Service: Open Heart Surgery;  Laterality: N/A;    Current Outpatient Prescriptions  Medication Sig Dispense Refill  . acetaminophen (TYLENOL) 500 MG tablet Take 1,000 mg by mouth 3 (three) times daily as needed for mild pain or headache (for arthritis pain).     Marland Kitchen diltiazem (CARDIZEM CD) 240 MG 24 hr capsule Take 1 capsule (240 mg total) by mouth daily. 90 capsule 3  . ELIQUIS 5 MG TABS tablet Take 5 mg by mouth 2 (two) times daily.     . fluticasone (FLONASE) 50 MCG/ACT nasal spray Place 2 sprays into both nostrils daily. Reported on 05/29/2015    . furosemide (LASIX) 20 MG tablet Take 20 mg by mouth daily.     Marland Kitchen levothyroxine (SYNTHROID, LEVOTHROID) 50 MCG tablet Take 50 mcg by mouth as directed. Tuesdays, Thursdays, Saturdays, Sundays    . levothyroxine (SYNTHROID,  LEVOTHROID) 75 MCG tablet Take 75 mcg by mouth as directed. Mondays, Wednesdays, and Fridays    . losartan (COZAAR) 50 MG tablet Take 1 tablet (50 mg total) by mouth daily. 90 tablet 1  . tretinoin (RETIN-A) 0.05 % cream Apply 1 application topically daily as needed (for skin). Apply a small amount to skin daily    . venlafaxine XR (EFFEXOR-XR) 37.5 MG 24 hr capsule Take 37.5 mg by mouth daily.      No current facility-administered medications for this visit.     Allergies:   Patient has no known allergies.   Social History:  The patient  reports that she has never smoked. She has never used smokeless tobacco. She reports that she drinks alcohol. She reports  that she does not use drugs.   Family History:  The patient's  family history includes CAD in her mother; Heart attack in her maternal uncle.    ROS:  Please see the history of present illness.  Otherwise, review of systems is positive for depression, easy bruising, back pain, snoring.  All other systems are reviewed and negative.    PHYSICAL EXAM: VS:  BP 140/80   Pulse 86   Ht 5\' 3"  (1.6 m)   Wt 159 lb 1.9 oz (72.2 kg)   BMI 28.19 kg/m  , BMI Body mass index is 28.19 kg/m. GEN: Well nourished, well developed, pleasant elderly woman in no acute distress  HEENT: normal  Neck: no JVD, no masses. No carotid bruits Cardiac: irregularly irregular, 2/6 systolic murmur at the RUSB, no diastolic murmur             Respiratory:  clear to auscultation bilaterally, normal work of breathing GI: soft, nontender, nondistended, + BS MS: no deformity or atrophy  Ext: no pretibial edema, pedal pulses 2+= bilaterally Skin: warm and dry, no rash Neuro:  Strength and sensation are intact Psych: euthymic mood, full affect  EKG:  EKG is ordered today. The ekg ordered today shows atrial fibrillation 83 bpm, low voltage QRS, anterior infarct age-undetermined  Recent Labs: 10/31/2015: BUN 13; Creat 0.69; Hemoglobin 14.5; Platelets 205; Potassium 4.2; Sodium 136   Lipid Panel     Component Value Date/Time   CHOL 243 (H) 07/17/2006 2053      Wt Readings from Last 3 Encounters:  08/16/16 159 lb 1.9 oz (72.2 kg)  10/31/15 173 lb (78.5 kg)  07/28/15 171 lb 3.2 oz (77.7 kg)     Cardiac Studies Reviewed: Echo images reviewed (formal interpretation pending): LV systolic function appears normal. Transcatheter aortic valve function appears normal with peak and mean gradients of 17 and 9 mmHg, respectively. There is no paravalvular regurgitation.  ASSESSMENT AND PLAN: 1. Aortic valve disease now one year out from TAVR: The patient has normal function of her aortic valve bioprosthesis. Clinical  follow-up is recommended. She does not require further echo imaging and less new symptoms arise or her exam is suggestive of valvular dysfunction. SBE prophylaxis is reviewed again with the patient today.  2. Permanent atrial fibrillation: Anticoagulated with Eliquis. Heart rate is controlled with diltiazem.  Current medicines are reviewed with the patient today.  The patient does not have concerns regarding medicines.  Labs/ tests ordered today include:  No orders of the defined types were placed in this encounter.   Disposition:   The patient is relocating to [place]. She will establish with a cardiologist there. Her son is an Horticulturist, commercial in [place] and will provide a recommendation for  follow-up.   Deatra James, MD  08/16/2016 4:21 PM    [facility] [place], [place], [place]  [address] Phone: [phone]; Fax: [phone]

## 2016-08-20 ENCOUNTER — Other Ambulatory Visit: Payer: Self-pay | Admitting: Cardiovascular Disease

## 2016-10-25 ENCOUNTER — Other Ambulatory Visit: Payer: Self-pay

## 2016-10-25 MED ORDER — LOSARTAN POTASSIUM 50 MG PO TABS: 50.0000 mg | ORAL_TABLET | Freq: Every day | ORAL | 2 refills | Status: AC

## 2016-11-07 ENCOUNTER — Other Ambulatory Visit: Payer: Self-pay

## 2016-11-07 MED ORDER — DILTIAZEM HCL ER COATED BEADS 240 MG PO CP24: 240.0000 mg | ORAL_CAPSULE | Freq: Every day | ORAL | 1 refills | Status: DC

## 2017-03-22 ENCOUNTER — Other Ambulatory Visit: Payer: Self-pay | Admitting: Cardiology

## 2021-02-03 DEATH — deceased
